# Patient Record
Sex: Male | Born: 1935 | Race: White | Hispanic: No | Marital: Married | Smoking: Former smoker
Health system: Southern US, Community
[De-identification: ages and names within clinical notes are randomized; demographics above are authoritative.]

## PROBLEM LIST (undated history)

## (undated) DIAGNOSIS — S065X9A Traumatic subdural hemorrhage with loss of consciousness of unspecified duration, initial encounter: Secondary | ICD-10-CM

## (undated) DIAGNOSIS — I480 Paroxysmal atrial fibrillation: Secondary | ICD-10-CM

## (undated) DIAGNOSIS — M1990 Unspecified osteoarthritis, unspecified site: Secondary | ICD-10-CM

## (undated) DIAGNOSIS — S065XAA Traumatic subdural hemorrhage with loss of consciousness status unknown, initial encounter: Secondary | ICD-10-CM

## (undated) DIAGNOSIS — K219 Gastro-esophageal reflux disease without esophagitis: Secondary | ICD-10-CM

## (undated) DIAGNOSIS — I10 Essential (primary) hypertension: Secondary | ICD-10-CM

## (undated) HISTORY — PX: OTHER SURGICAL HISTORY: SHX169

## (undated) HISTORY — PX: INGUINAL HERNIA REPAIR: SUR1180

## (undated) HISTORY — PX: VASECTOMY: SHX75

## (undated) HISTORY — DX: Paroxysmal atrial fibrillation: I48.0

## (undated) HISTORY — PX: FOOT MASS EXCISION: SHX1663

## (undated) HISTORY — PX: TONSILLECTOMY: SUR1361

---

## 2000-05-18 ENCOUNTER — Encounter: Payer: Self-pay | Admitting: Gastroenterology

## 2000-05-18 ENCOUNTER — Other Ambulatory Visit: Admission: RE | Admit: 2000-05-18 | Discharge: 2000-05-18 | Payer: Self-pay | Admitting: Gastroenterology

## 2000-05-18 ENCOUNTER — Encounter (INDEPENDENT_AMBULATORY_CARE_PROVIDER_SITE_OTHER): Payer: Self-pay | Admitting: Specialist

## 2001-07-16 ENCOUNTER — Encounter: Payer: Self-pay | Admitting: Internal Medicine

## 2001-07-16 ENCOUNTER — Ambulatory Visit (HOSPITAL_COMMUNITY): Admission: RE | Admit: 2001-07-16 | Discharge: 2001-07-16 | Payer: Self-pay | Admitting: Internal Medicine

## 2002-09-12 ENCOUNTER — Ambulatory Visit (HOSPITAL_BASED_OUTPATIENT_CLINIC_OR_DEPARTMENT_OTHER): Admission: RE | Admit: 2002-09-12 | Discharge: 2002-09-12 | Payer: Self-pay | Admitting: Orthopedic Surgery

## 2002-09-12 ENCOUNTER — Encounter (INDEPENDENT_AMBULATORY_CARE_PROVIDER_SITE_OTHER): Payer: Self-pay | Admitting: *Deleted

## 2004-11-22 ENCOUNTER — Ambulatory Visit (HOSPITAL_COMMUNITY): Admission: RE | Admit: 2004-11-22 | Discharge: 2004-11-22 | Payer: Self-pay | Admitting: Internal Medicine

## 2005-09-26 ENCOUNTER — Ambulatory Visit: Payer: Self-pay | Admitting: Gastroenterology

## 2006-06-15 ENCOUNTER — Ambulatory Visit: Payer: Self-pay | Admitting: Gastroenterology

## 2006-06-22 ENCOUNTER — Encounter (INDEPENDENT_AMBULATORY_CARE_PROVIDER_SITE_OTHER): Payer: Self-pay | Admitting: *Deleted

## 2006-06-22 ENCOUNTER — Ambulatory Visit: Payer: Self-pay | Admitting: Gastroenterology

## 2007-05-21 ENCOUNTER — Ambulatory Visit: Payer: Self-pay | Admitting: Gastroenterology

## 2007-10-09 HISTORY — PX: TRANSURETHRAL RESECTION OF PROSTATE: SHX73

## 2007-10-20 ENCOUNTER — Inpatient Hospital Stay (HOSPITAL_COMMUNITY): Admission: RE | Admit: 2007-10-20 | Discharge: 2007-10-22 | Payer: Self-pay | Admitting: Urology

## 2007-10-20 ENCOUNTER — Encounter (INDEPENDENT_AMBULATORY_CARE_PROVIDER_SITE_OTHER): Payer: Self-pay | Admitting: Urology

## 2008-04-21 DIAGNOSIS — K449 Diaphragmatic hernia without obstruction or gangrene: Secondary | ICD-10-CM | POA: Insufficient documentation

## 2008-04-21 DIAGNOSIS — K648 Other hemorrhoids: Secondary | ICD-10-CM | POA: Insufficient documentation

## 2008-04-21 DIAGNOSIS — D126 Benign neoplasm of colon, unspecified: Secondary | ICD-10-CM

## 2008-04-21 DIAGNOSIS — K5730 Diverticulosis of large intestine without perforation or abscess without bleeding: Secondary | ICD-10-CM

## 2008-04-21 DIAGNOSIS — K222 Esophageal obstruction: Secondary | ICD-10-CM | POA: Insufficient documentation

## 2008-04-21 DIAGNOSIS — K208 Other esophagitis: Secondary | ICD-10-CM

## 2008-05-02 ENCOUNTER — Ambulatory Visit (HOSPITAL_COMMUNITY): Admission: RE | Admit: 2008-05-02 | Discharge: 2008-05-02 | Payer: Self-pay | Admitting: Internal Medicine

## 2009-05-04 ENCOUNTER — Encounter (INDEPENDENT_AMBULATORY_CARE_PROVIDER_SITE_OTHER): Payer: Self-pay | Admitting: *Deleted

## 2009-05-11 ENCOUNTER — Ambulatory Visit (HOSPITAL_COMMUNITY): Admission: RE | Admit: 2009-05-11 | Discharge: 2009-05-11 | Payer: Self-pay | Admitting: Orthopaedic Surgery

## 2009-06-05 ENCOUNTER — Encounter (HOSPITAL_COMMUNITY): Admission: RE | Admit: 2009-06-05 | Discharge: 2009-07-05 | Payer: Self-pay | Admitting: Internal Medicine

## 2009-07-04 ENCOUNTER — Ambulatory Visit: Payer: Self-pay | Admitting: Gastroenterology

## 2009-07-16 ENCOUNTER — Ambulatory Visit: Payer: Self-pay | Admitting: Gastroenterology

## 2009-07-16 ENCOUNTER — Encounter: Payer: Self-pay | Admitting: Gastroenterology

## 2009-07-18 ENCOUNTER — Encounter: Payer: Self-pay | Admitting: Gastroenterology

## 2010-02-20 ENCOUNTER — Telehealth: Payer: Self-pay | Admitting: Gastroenterology

## 2010-02-27 ENCOUNTER — Telehealth: Payer: Self-pay | Admitting: Gastroenterology

## 2010-02-27 ENCOUNTER — Ambulatory Visit: Payer: Self-pay | Admitting: Gastroenterology

## 2010-02-27 DIAGNOSIS — K219 Gastro-esophageal reflux disease without esophagitis: Secondary | ICD-10-CM | POA: Insufficient documentation

## 2010-02-27 DIAGNOSIS — C61 Malignant neoplasm of prostate: Secondary | ICD-10-CM | POA: Insufficient documentation

## 2010-02-27 LAB — CONVERTED CEMR LAB
ALT: 22 units/L (ref 0–53)
AST: 21 units/L (ref 0–37)
Albumin: 3.9 g/dL (ref 3.5–5.2)
Alkaline Phosphatase: 67 units/L (ref 39–117)
Basophils Relative: 0.1 % (ref 0.0–3.0)
Eosinophils Relative: 2.6 % (ref 0.0–5.0)
Iron: 57 ug/dL (ref 42–165)
MCV: 89.1 fL (ref 78.0–100.0)
Monocytes Absolute: 0.5 10*3/uL (ref 0.1–1.0)
Neutrophils Relative %: 74.4 % (ref 43.0–77.0)
RBC: 4.9 M/uL (ref 4.22–5.81)
Total Protein: 6.8 g/dL (ref 6.0–8.3)
WBC: 9.7 10*3/uL (ref 4.5–10.5)

## 2011-01-07 NOTE — Progress Notes (Signed)
Summary: refill omeprazole  Phone Note Call from Patient Call back at [facility] Phone [phone]   Refills Requested: Medication #1:  OMEPRAZOLE 20 MG TBEC Take 1 capsule by mouth twice a day. Initial call taken by: Harlow Mares CMA Duncan Dull),  February 20, 2010 9:41 AM Caller: Patient Summary of Call: patient needs omeprazole refilled fax [phone] wants a 90 day supply. I had him make an appt for 02/27/2010 since he has not been seen since 2008. Initial call taken by: Harlow Mares CMA Duncan Dull),  February 20, 2010 9:41 AM  Follow-up for Phone Call        Rx faxed. Follow-up by: Ashok Cordia RN,  February 20, 2010 10:06 AM    New/Updated Medications: OMEPRAZOLE 20 MG TBEC (OMEPRAZOLE) Take 1 capsule by mouth twice a day Prescriptions: OMEPRAZOLE 20 MG TBEC (OMEPRAZOLE) Take 1 capsule by mouth twice a day  #180 x 3   Entered by:   Ashok Cordia RN   Authorized by:   Mardella Layman MD [facility]   Signed by:   Ashok Cordia RN on 02/20/2010   Method used:   Printed then faxed to ...       [facility] (retail)       [address]       [place], [place]  [address]       Ph: [phone]       Fax: [phone]   RxID:   [id number]

## 2011-01-07 NOTE — Progress Notes (Signed)
Summary: med update  Phone Note Call from Patient Call back at Home Phone [phone]   Caller: Patient Call For: Dr. Jarold Motto Reason for Call: Talk to Nurse Summary of Call: pt wants Lupita Leash to know that his Zestoretic rx is 20-12.5mg  tablets Initial call taken by: Vallarie Mare,  February 27, 2010 1:17 PM  Follow-up for Phone Call        Dose changed on med sheet. Follow-up by: Ashok Cordia RN,  February 27, 2010 2:10 PM    New/Updated Medications: ZESTORETIC 20-12.5 MG TABS (LISINOPRIL-HYDROCHLOROTHIAZIDE) 1 qd

## 2011-01-07 NOTE — Assessment & Plan Note (Signed)
Summary: follow up gerd/lk   History of Present Illness Visit Type: Follow-up Visit Primary GI MD: Sheryn Bison MD FACP FAGA Primary Provider: Carylon Perches, MD Chief Complaint: follow-up visit GERD. Pt. has no complaints at this time History of Present Illness:   77 year old Caucasian male with chronic GERD currently asymptomatic on daily omeprazole. He is up-to-date with colonoscopy exam and denies lower GI complaints. He had endoscopy 10 years ago with dilation of esophageal stricture. There was no evidence of Barrett's esophagitis. He currently denies dysphasia, chest pain, anorexia, weight loss, rectal bleeding, or any hepatobiliary complaints. He is followed by Dr. Shiela Mayer in urology for prostate cancer and is status post TURP. He denies current genitourinary complaints.   GI Review of Systems      Denies abdominal pain, acid reflux, belching, bloating, chest pain, dysphagia with liquids, dysphagia with solids, heartburn, loss of appetite, nausea, vomiting, vomiting blood, weight loss, and  weight gain.        Denies anal fissure, black tarry stools, change in bowel habit, diarrhea, diverticulosis, fecal incontinence, heme positive stool, hemorrhoids, irritable bowel syndrome, jaundice, light color stool, liver problems, rectal bleeding, and  rectal pain. Preventive Screening-Counseling & Management  Alcohol-Tobacco     Smoking Status: quit      Drug Use:  no.      Current Medications (verified): 1)  Omeprazole 20 Mg Tbec (Omeprazole) .... Take 1 Capsule By Mouth Once A Day 2)  Zestoretic 10-12.5 Mg Tabs (Lisinopril-Hydrochlorothiazide) .Marland Kitchen.. 1 Tablet By Mouth Once Daily 3)  Metamucil 30.9 % Powd (Psyllium) .... Take Mixture Once Daily 4)  Stool Softener 100 Mg Caps (Docusate Sodium) .Marland Kitchen.. 1 Capsule By Mouth Once Daily 5)  Aspirin 81 Mg Tbec (Aspirin) .Marland Kitchen.. 1 Tablet By Mouth Once Daily  Allergies (verified): No Known Drug Allergies  Past History:  Past medical, surgical,  family and social histories (including risk factors) reviewed for relevance to current acute and chronic problems.  Past Medical History: Reviewed history from 04/21/2008 and no changes required. Current Problems:  HEMORRHOIDS, INTERNAL (ICD-455.0) DIVERTICULOSIS, COLON (ICD-562.10) COLONIC POLYPS (ICD-211.3) EROSIVE ESOPHAGITIS (ICD-530.19) ESOPHAGEAL STRICTURE (ICD-530.3) HIATAL HERNIA (ICD-553.3)  Past Surgical History: Tonsillectomy Hernia Surgery  Family History: Reviewed history and no changes required. No FH of Colon Cancer: Lung Cancer-father Family History of Heart Disease: Mother  Social History: Reviewed history and no changes required. Patient is a former smoker.  Married  2 boys Alcohol Use - no Daily Caffeine Use Illicit Drug Use - no Retired  Retail buyer Status:  quit Drug Use:  no  Review of Systems       The patient complains of arthritis/joint pain, muscle pains/cramps, and urination changes/pain.  The patient denies allergy/sinus, anemia, anxiety-new, back pain, blood in urine, breast changes/lumps, change in vision, confusion, cough, coughing up blood, depression-new, fainting, fatigue, fever, headaches-new, hearing problems, heart murmur, heart rhythm changes, itching, night sweats, nosebleeds, shortness of breath, skin rash, sleeping problems, sore throat, swelling of feet/legs, swollen lymph glands, thirst - excessive, urination - excessive, urine leakage, vision changes, and voice change.    Vital Signs:  Patient profile:   77 year old male Height:      70 inches Weight:      192.25 pounds BMI:     27.68 Pulse rate:   76 / minute Pulse rhythm:   regular BP sitting:   108 / 64  (left arm)  Vitals Entered By: Milford Cage NCMA (February 27, 2010 11:28 AM)  Physical Exam  General:  Well developed, well nourished, no acute distress.healthy appearing.   Head:  Normocephalic and atraumatic. Eyes:  PERRLA, no icterus.exam deferred to  patient's ophthalmologist.   Lungs:  Clear throughout to auscultation. Heart:  Regular rate and rhythm; no murmurs, rubs,  or bruits. Abdomen:  Soft, nontender and nondistended. No masses, hepatosplenomegaly or hernias noted. Normal bowel sounds. Extremities:  No clubbing, cyanosis, edema or deformities noted. Neurologic:  Alert and  oriented x4;  grossly normal neurologically. Cervical Nodes:  No significant cervical adenopathy. Psych:  Alert and cooperative. Normal mood and affect.   Impression & Recommendations:  Problem # 1:  GERD (ICD-530.81) Assessment Improved Continue antireflux maneuvers and daily omeprazole 20 mg 30 minutes before breakfast. Because of the chronicity of his medication use I will check CBC, liver profile, and anemia profile.  Problem # 2:  HEMORRHOIDS, INTERNAL (ICD-455.0) Assessment: Improved High fiber diet as tolerated and continue daily Metamucil.  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 Vonita Moss as needed.  Other Orders: TLB-Hepatic/Liver Function Pnl (80076-HEPATIC) TLB-B12, Serum-Total ONLY (16109-U04) TLB-Ferritin (82728-FER) TLB-Folic Acid (Folate) (82746-FOL) TLB-IBC Pnl (Iron/FE;Transferrin) (83550-IBC) TLB-CBC Platelet - w/Differential (85025-CBCD)  Patient Instructions: 1)  Please go to the basement for lab work. 2)  Please continue current medications.  3)  The medication list was reviewed and reconciled.  All changed / newly prescribed medications were explained.  A complete medication list was provided to the patient / caregiver. 4)  Please schedule a follow-up appointment as needed.  5)  Copy sent to : Dr. Carylon Perches and Dr. Larey Dresser in urology

## 2011-04-22 NOTE — Procedures (Signed)
NAME:  Glenn Bean, HUNZEKER NO.:  [id number]   MEDICAL RECORD NO.:  [id number]          PATIENT TYPE:  REC   LOCATION:  RESP                          FACILITY:  [facility]   PHYSICIAN:  Kingsley Callander. Ouida Sills, MD       DATE OF BIRTH:  [birth year]   DATE OF PROCEDURE:  06/05/2009  DATE OF DISCHARGE:                                  STRESS TEST   [name] underwent a Myoview stress test for evaluation of symptoms of  chest pain.  He exercised 7 minutes and 28 seconds (1 minute 28 seconds  in stage III of the Bruce protocol) attaining a maximal heart rate of  133 (90% of the age-predicted maximal heart rate) and a workload of 8.8  METs and discontinued exercise due to fatigue.  The treadmill was  manually slowed during stage III to 2.9 miles per hour.  There were no  symptoms of chest pain.  There were infrequent PVCs.  There were no ST-  segment changes diagnostic of ischemia.  The baseline electrocardiogram  revealed normal sinus rhythm at 71 beats per minute with sinus  arrhythmia.   IMPRESSION:  No evidence of exercise-induced ischemia.  Myoview images  pending.      Kingsley Callander. Ouida Sills, MD  Electronically Signed     ROF/MEDQ  D:  06/05/2009  T:  06/05/2009  Job:  [id number]

## 2011-04-22 NOTE — Op Note (Signed)
NAME:  DESTIN, VINSANT NO.:  [id number]   MEDICAL RECORD NO.:  [id number]          PATIENT TYPE:  INP   LOCATION:  X006                         FACILITY:  [facility]   PHYSICIAN:  Maretta Bees. Vonita Moss, M.D.DATE OF BIRTH:  [birth year]   DATE OF PROCEDURE:  10/20/2007  DATE OF DISCHARGE:                               OPERATIVE REPORT   PREOPERATIVE DIAGNOSIS:  Bladder outlet obstruction/benign prostatic  hypertrophy, large bladder diverticulum and large postvoid residual  urines.   POSTOPERATIVE DIAGNOSIS:  Bladder outlet obstruction/benign prostatic  hypertrophy, large bladder diverticulum and large postvoid residual  urines.   PROCEDURE:  Transurethral resection of prostate.   SURGEON:  Dr. Larey Dresser.   ANESTHESIA:  General.   INDICATIONS:  This gentleman had a TUR of prostate in the 80s and in the  last recent months he has developed some increased postvoid residual  urines contributed in parts due to a large posterior bladder  diverticulum.  He typically holds about 400 mL in bladder 400 mL of  retained urine in the diverticulum.  He undergone renal ultrasound that  showed no obstruction of the upper tracts.  Urodynamics showed a large  bladder that was compliant but somewhat hyposensitive.  He could not  void during that study although he did void and has voided  spontaneously.  He has been placed on Urecholine and I&O catheter to  decompress the bladder.  He was cystoscoped and was noted to be some  anterior obstructing tissue and was felt appropriate to resect out  further tissue to give him a better chance to void to completion.   PROCEDURE:  The patient brought to the operating room, placed in  lithotomy position.  External genitalia were prepped and draped in usual  fashion.  He was sounded from 24 to 1 [place] with [employer].  A  28-French resectoscope sheath was inserted and he had a collar of  obstructing tissue at the apical part of the  prostate plus anterior  tissue dropping down into the prostatic fossa.  His bladder diverticulum  was just beyond the trigone and very close and directly posterior and  there were no lesions within the diverticulum or the normal bladder.  The UO's were right next to the bladder neck.  First of all I resected  the collar of tissue apically and then resected some tissue on the floor  of the bladder along with the bladder neck at 6 o'clock, then anterior  tissue was resected down to capsule.  At the completion of the procedure  residual chips removed from the bladder and the diverticulum.  The  ureteral orifices were intact but again as noted just inside the bladder  neck.  There was good hemostasis.  Estimated  blood loss was 100 mL.  He tolerated the procedure well and was taken to  recovery room in good condition after inserting a 24-French 30 mL  catheter with 45 mL placed in the balloon.  Catheter was placed on  traction and irrigation was clear.  Estimated blood loss was 100 mL.      Maretta Bees. Vonita Moss,  M.D.  Electronically Signed     LJP/MEDQ  D:  10/20/2007  T:  10/21/2007  Job:  [id number]

## 2011-04-25 NOTE — Op Note (Signed)
   NAME:  JACK, BOLIO NO.:  [id number]   MEDICAL RECORD NO.:  [id number]                   PATIENT TYPE:  AMB   LOCATION:  DSC                                  FACILITY:  [facility]   PHYSICIAN:  Harvie Junior, M.D.                DATE OF BIRTH:  11/28/1933   DATE OF PROCEDURE:  09/12/2002  DATE OF DISCHARGE:                                 OPERATIVE REPORT   PREOPERATIVE DIAGNOSIS:  Mass second toe.   POSTOPERATIVE DIAGNOSIS:  Mass second toe.   OPERATION:  Excision mass second toe.   SURGEON:  Harvie Junior, M.D.   ASSISTANT:  Currie Paris. Thedore Mins.   ANESTHESIA:  Ankle block   BRIEF HISTORY:  He is a 77 year old male with a long history of having a  mass on his second toe.  He came to have it evaluated.  It is a freely  mobile mass.  It was felt that it was a nonmalignancy.  Ultimately, we  talked about excisional biopsy versus monitoring because of the large size  of it.  We did feel that it needed to be evaluated with MRI or have an  excisional biopsy.  He ultimately came for an excisional biopsy.   DESCRIPTION OF PROCEDURE:  The patient was taken to the operating room and  after adequate anesthesia was obtained with the patient on the operating  room table, the right leg was then prepped and draped in the usual sterile  fashion.  After this, an elliptical incision was made to ellipse some skin.  The mass was identified.  The borders were freed.  It was fully excised and  sent to pathology.  Following this, the wound was copiously irrigated and  suctioned dry.  The skin was then closed with 4-0 Nylon interrupted sutures.  A sterile compressive dressing was applied and the patient was then taken to  the recovery room where he was noted to be in satisfactory condition.  Estimated blood loss during the procedure was unknown.                                                 Harvie Junior, M.D.    Ranae Plumber  D:  09/12/2002  T:  09/13/2002   Job:  [id number]   cc:   Kingsley Callander. Ouida Sills, M.D.

## 2011-04-25 NOTE — Discharge Summary (Signed)
NAME:  SHANDY, VI NO.:  [id number]   MEDICAL RECORD NO.:  [id number]          PATIENT TYPE:  INP   LOCATION:  1427                         [facility]AC[facility]:  [facility]   PHYSICIAN:  Maretta Bees. Vonita Moss, M.D.DATE OF BIRTH:  30-Oct-1933   DATE OF ADMISSION:  10/20/2007  DATE OF DISCHARGE:  10/22/2007                               DISCHARGE SUMMARY   FINAL DIAGNOSES:  1. Bladder outlet obstruction due to benign prostatic hyperplasia.  2. Elevated residual urines due to #1.  3. Bladder diverticulum.  4. Hypertension.  5. Gastroesophageal reflux disease.  6. Hypotonic bladder.  7. Prostate carcinoma found in prostate chips.   PROCEDURE:  Transurethral resection of prostate on October 20, 2007.   HISTORY:  This gentleman has had a significant problem with elevated  residual urines and large bladder diverticulum.  He had a previous TUR  of the prostate in the 1980s.  Urodynamics showed a hypotonic bladder  and some partial obstruction.   HOSPITAL COURSE:  After admission, he underwent TUR of the prostate and  his postop course was benign.  His Foley catheter was removed on  October 22, 2007 and he voided satisfactorily and is ready for  discharge.   DIET:  He is to go home on a heart-healthy diet.   CONDITION ON DISCHARGE:  He was sent home in improved condition.   ACTIVITY:  He will go home on limited activity for 1 month.   FOLLOWUP:  He will return to the office in 2 weeks in followup.   DISCHARGE MEDICATIONS:  He was sent home on his Zestoretic and can  restart his 81-mg aspirin and for further antibiotic therapy will be  evaluated pending his culture results.      Maretta Bees. Vonita Moss, M.D.  Electronically Signed     LJP/MEDQ  D:  11/01/2007  T:  11/02/2007  Job:  [id number]

## 2011-07-23 ENCOUNTER — Telehealth: Payer: Self-pay | Admitting: Gastroenterology

## 2011-07-23 MED ORDER — OMEPRAZOLE 20 MG PO CPDR: 20.0000 mg | DELAYED_RELEASE_CAPSULE | Freq: Every day | ORAL | Status: DC

## 2011-07-23 NOTE — Telephone Encounter (Signed)
rx sent pt aware 

## 2011-08-30 ENCOUNTER — Emergency Department (HOSPITAL_COMMUNITY)
Admission: EM | Admit: 2011-08-30 | Discharge: 2011-08-30 | Discharge status: Against Medical Advice | Payer: Self-pay | Attending: Emergency Medicine | Admitting: Emergency Medicine

## 2011-08-30 ENCOUNTER — Encounter: Payer: Self-pay | Admitting: *Deleted

## 2011-08-30 ENCOUNTER — Emergency Department (HOSPITAL_COMMUNITY)
Admission: EM | Admit: 2011-08-30 | Discharge: 2011-08-31 | Disposition: A | Discharge status: Home / Self Care | Payer: Medicare Other | Attending: Emergency Medicine | Admitting: Emergency Medicine

## 2011-08-30 DIAGNOSIS — Y92410 Unspecified street and highway as the place of occurrence of the external cause: Secondary | ICD-10-CM | POA: Insufficient documentation

## 2011-08-30 DIAGNOSIS — Z5320 Procedure and treatment not carried out because of patient's decision for unspecified reasons: Secondary | ICD-10-CM | POA: Insufficient documentation

## 2011-08-30 DIAGNOSIS — S0280XA Fracture of other specified skull and facial bones, unspecified side, initial encounter for closed fracture: Secondary | ICD-10-CM | POA: Insufficient documentation

## 2011-08-30 DIAGNOSIS — Y992 Volunteer activity: Secondary | ICD-10-CM | POA: Insufficient documentation

## 2011-08-30 DIAGNOSIS — S0120XA Unspecified open wound of nose, initial encounter: Secondary | ICD-10-CM | POA: Insufficient documentation

## 2011-08-30 DIAGNOSIS — S0292XA Unspecified fracture of facial bones, initial encounter for closed fracture: Secondary | ICD-10-CM

## 2011-08-30 DIAGNOSIS — I10 Essential (primary) hypertension: Secondary | ICD-10-CM | POA: Insufficient documentation

## 2011-08-30 DIAGNOSIS — M542 Cervicalgia: Secondary | ICD-10-CM | POA: Insufficient documentation

## 2011-08-30 DIAGNOSIS — S022XXA Fracture of nasal bones, initial encounter for closed fracture: Secondary | ICD-10-CM | POA: Insufficient documentation

## 2011-08-30 DIAGNOSIS — S0121XA Laceration without foreign body of nose, initial encounter: Secondary | ICD-10-CM
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
## 2011-08-31 ENCOUNTER — Emergency Department (HOSPITAL_COMMUNITY): Payer: Medicare Other

## 2011-08-31 MED ORDER — OXYCODONE-ACETAMINOPHEN 5-325 MG PO TABS: 1.0000 | ORAL_TABLET | ORAL | Status: AC | PRN

## 2011-08-31 MED ORDER — ONDANSETRON HCL 4 MG/2ML IJ SOLN
4.0000 mg | Freq: Once | INTRAMUSCULAR | Status: AC
  Administered 2011-08-31: 4 mg via INTRAVENOUS
  Filled 2011-08-31: qty 2

## 2011-08-31 MED ORDER — LIDOCAINE-EPINEPHRINE-TETRACAINE (LET) SOLUTION: 3.0000 mL | Freq: Once | NASAL | Status: DC

## 2011-08-31 MED ORDER — TETANUS-DIPHTH-ACELL PERTUSSIS 5-2.5-18.5 LF-MCG/0.5 IM SUSP
0.5000 mL | Freq: Once | INTRAMUSCULAR | Status: AC
  Administered 2011-08-31: 0.5 mL via INTRAMUSCULAR
  Filled 2011-08-31: qty 0.5

## 2011-08-31 MED ORDER — AMOXICILLIN 500 MG PO CAPS: 500.0000 mg | ORAL_CAPSULE | Freq: Three times a day (TID) | ORAL | Status: AC

## 2011-08-31 MED ORDER — LIDOCAINE-EPINEPHRINE-TETRACAINE (LET) SOLUTION
NASAL | Status: AC
  Filled 2011-08-31: qty 3

## 2011-08-31 MED ORDER — MORPHINE SULFATE 4 MG/ML IJ SOLN
4.0000 mg | Freq: Once | INTRAMUSCULAR | Status: AC
  Administered 2011-08-31: 4 mg via INTRAVENOUS
  Filled 2011-08-31: qty 1

## 2011-08-31 MED ORDER — LIDOCAINE HCL (PF) 1 % IJ SOLN
INTRAMUSCULAR | Status: AC
  Administered 2011-08-31: 5 mL
  Filled 2011-08-31: qty 5
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
## 2011-09-16 LAB — BASIC METABOLIC PANEL
BUN: 13
CO2: 33 — ABNORMAL HIGH
Calcium: 8.3 — ABNORMAL LOW
Calcium: 9.4
GFR calc non Af Amer: 57 — ABNORMAL LOW
Glucose, Bld: 106 — ABNORMAL HIGH
Glucose, Bld: 124 — ABNORMAL HIGH
Potassium: 3.5
Sodium: 136

## 2011-09-16 LAB — URINE CULTURE
Colony Count: >=100000
Special Requests: NEGATIVE

## 2011-09-16 LAB — CBC
Platelets: 217
RDW: 13

## 2011-10-08 ENCOUNTER — Ambulatory Visit (HOSPITAL_COMMUNITY)
Admission: RE | Admit: 2011-10-08 | Discharge: 2011-10-08 | Disposition: A | Discharge status: Home / Self Care | Payer: Medicare Other | Source: Ambulatory Visit | Attending: Internal Medicine | Admitting: Internal Medicine

## 2011-10-08 DIAGNOSIS — I10 Essential (primary) hypertension: Secondary | ICD-10-CM | POA: Insufficient documentation

## 2011-10-08 DIAGNOSIS — M436 Torticollis: Secondary | ICD-10-CM | POA: Insufficient documentation

## 2011-10-08 DIAGNOSIS — M6281 Muscle weakness (generalized): Secondary | ICD-10-CM | POA: Insufficient documentation

## 2011-10-08 DIAGNOSIS — IMO0001 Reserved for inherently not codable concepts without codable children: Secondary | ICD-10-CM | POA: Insufficient documentation

## 2011-10-08 DIAGNOSIS — M5382 Other specified dorsopathies, cervical region: Secondary | ICD-10-CM | POA: Insufficient documentation

## 2011-10-08 DIAGNOSIS — M542 Cervicalgia: Secondary | ICD-10-CM | POA: Insufficient documentation
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
## 2011-10-15 ENCOUNTER — Ambulatory Visit (HOSPITAL_COMMUNITY)
Admission: RE | Admit: 2011-10-15 | Discharge: 2011-10-15 | Disposition: A | Discharge status: Home / Self Care | Payer: Medicare Other | Source: Ambulatory Visit | Attending: Internal Medicine | Admitting: Internal Medicine

## 2011-10-15 DIAGNOSIS — I10 Essential (primary) hypertension: Secondary | ICD-10-CM | POA: Insufficient documentation

## 2011-10-15 DIAGNOSIS — M542 Cervicalgia: Secondary | ICD-10-CM | POA: Insufficient documentation

## 2011-10-15 DIAGNOSIS — IMO0001 Reserved for inherently not codable concepts without codable children: Secondary | ICD-10-CM | POA: Insufficient documentation

## 2011-10-15 DIAGNOSIS — M6281 Muscle weakness (generalized): Secondary | ICD-10-CM | POA: Insufficient documentation

## 2011-10-15 NOTE — Progress Notes (Signed)
Physical Therapy Treatment Patient Details  Name: Glenn Bean MRN: [id number] Date of Birth: 1933/06/29  Today's Date: 10/15/2011 Time: 4098-1191 Time Calculation (min): 50 min Charges: 69' TE, 5' Manual Visit#: 2  of 8   Re-eval: 11/07/11    Subjective: Symptoms/Limitations Symptoms: Pt reports that he is doing some of his exercises at home.  Pt denies any pain including radicular pain to L shoulder, but continues to have the most difficulty with cervical ROM.  Objective: Denies dizziness today with nystagmus testing, sitting <> S/L both directions.  Pain Assessment Currently in Pain?: No/denies  Exercise/Treatments Stretches Upper Trapezius Stretch: 3 reps;30 seconds;Limitations Upper Trapezius Stretch Limitations: Bilateral Levator Stretch: 3 reps;30 seconds;Limitations Levator Stretch Limitations: Bilateral  Neck Exercises Neck Retraction: Supine;10 reps;Limitations Neck Retraction Limitations: Supine: Retraction w/neck (capital ) flexion 2x5 w/5 sec hold Neck Rotation - Right: AROM;Seated;5 reps;Limitations Neck Rotation - Right Limitations: Eyes fixed on object x10, Eyes moving with rotation x10 Neck Rotation - Left: AROM;Seated;Limitations Neck Rotation - Left Limitations: Eyes fixed on object x10, Eyes moving with rotation x10 Upper Extremity Lift: Limitations Uppder Extremity Lift Limitations: UE Lift and Chop to Bilateral sides w/head following, UE rotation with neck rotation opposite direction 10x each in supine Additional Neck Exercises UBE (Upper Arm Bike): 4 min 1.0 backwards for posture  Manual Therapy Manual Therapy: Other (comment) Other Manual Therapy: SCS to 3 locations to L cervical/shoulder region x5 minutes.    Physical Therapy Assessment and Plan PT Assessment and Plan Clinical Impression Statement: decreased spasms after manual technique today.  Overall improved his neck rotation with multimodal cueing in the supine position.  PT Plan: Add:  W-backs, x-v (start in supine if necessary).    Goals    Problem List Patient Active Problem List  Diagnoses  . MALIGNANT NEOPLASM OF PROSTATE  . COLONIC POLYPS  . HEMORRHOIDS, INTERNAL  . EROSIVE ESOPHAGITIS  . ESOPHAGEAL STRICTURE  . Esophageal reflux  . HIATAL HERNIA  . DIVERTICULOSIS, COLON  . Neck pain  . Neck muscle weakness  . Neck stiffness    PT - End of Session Activity Tolerance: Patient tolerated treatment well  Judah Chevere 10/15/2011, 5:02 PM

## 2011-10-16 ENCOUNTER — Encounter: Payer: Self-pay | Admitting: *Deleted

## 2011-10-17 ENCOUNTER — Ambulatory Visit (HOSPITAL_COMMUNITY)
Admission: RE | Admit: 2011-10-17 | Discharge: 2011-10-17 | Disposition: A | Discharge status: Home / Self Care | Payer: Medicare Other | Source: Ambulatory Visit | Attending: Physical Therapy | Admitting: Physical Therapy

## 2011-10-17 NOTE — Progress Notes (Signed)
Physical Therapy Treatment Patient Details  Name: Glenn Bean MRN: [id number] Date of Birth: October 04, 1933  Today's Date: 10/17/2011 Time: 4098-1191 Time Calculation (min): 48 min Charges: 76' NMR, 8' TE Visit#: 3  of 8   Re-eval: 11/07/11    Subjective: Symptoms/Limitations Symptoms: Pt reports that he is doing some of his exercises at home.  He denies pain.  Pain Assessment Currently in Pain?: No/denies  Exercise/Treatments Stretches Upper Trapezius Stretch: Limitations Upper Trapezius Stretch Limitations: Manual SB x5 each direction Seated Diaphragmatic breathing -cueing to quite accessory breathing muscles including scalenes and UT Neck Exercises Neck Retraction: Supine;10 reps Neck Retraction Limitations: Supine: Retraction w/neck (capital ) flexion 2x5 w/5 sec hold Neck Extension: 15 reps;AROM;Seated;Limitations Neck Extension Limitations: Cueing to improve rhomboid and middle and low trap activiation Neck Lateral Flexion - Right: AROM;5 reps;Limitations Neck Lateral Flexion - Right Limitations: Supine w/manual resistance Neck Lateral Flexion - Left: AAROM;5 reps;Supine;Limitations Neck Lateral Flexion - Left Limitations: Supine w/manual resistance Neck Rotation - Right: AROM;5 reps;Supine Neck Rotation - Right Limitations: Supine w/manual resistance Neck Rotation - Left: AROM;5 reps;Limitations Neck Rotation - Left Limitations: Supine w/manual resistance Shoulder Extension: AAROM;Both;10 reps;Seated;Limitations Shoulder Extension Limitations: manual cueing to improve LUE shoulder extension w/appropriate posture W Back: 10 reps;Limitations W Back Limitations: SUPINE X to V: 10 reps;Limitations X to V Limitations: SUPINE Uppder Extremity Lift Limitations: UE Lift and Chop to Bilateral sides w/head following, UE rotation with neck rotation opposite direction 10x each in supine Additional Neck Exercises UBE (Upper Arm Bike): 8 min 1.0 backwards for posture     Physical Therapy Assessment and Plan PT Assessment and Plan Clinical Impression Statement: Today's treatment focus on postural re-education through use of BUE movement. Pt has made some gains with neck rotation however continues to have the greatest limitations with cervical sidebending and L shoulder extension.   PT Plan: Cont to progress cervical SB and encourage appropriate posture.     Goals    Problem List Patient Active Problem List  Diagnoses  . MALIGNANT NEOPLASM OF PROSTATE  . COLONIC POLYPS  . HEMORRHOIDS, INTERNAL  . EROSIVE ESOPHAGITIS  . ESOPHAGEAL STRICTURE  . Esophageal reflux  . HIATAL HERNIA  . DIVERTICULOSIS, COLON  . Neck pain  . Neck muscle weakness  . Neck stiffness    PT - End of Session Activity Tolerance: Patient tolerated treatment well  Glenn Bean 10/17/2011, 4:52 PM

## 2011-10-22 ENCOUNTER — Ambulatory Visit (HOSPITAL_COMMUNITY)
Admission: RE | Admit: 2011-10-22 | Discharge: 2011-10-22 | Disposition: A | Discharge status: Home / Self Care | Payer: Medicare Other | Source: Ambulatory Visit | Attending: Internal Medicine | Admitting: Internal Medicine

## 2011-10-22 NOTE — Progress Notes (Signed)
Physical Therapy Treatment Patient Details  Name: Glenn Bean MRN: [id number] Date of Birth: 08/09/1933  Today's Date: 10/22/2011 Time: 1308-6578 Time Calculation (min): 40 min Visit#: 4  of 8   Re-eval: 11/07/11  Charge: therex 40 min'  Subjective: Symptoms/Limitations Symptoms: Pt neck pain free right now but stated he had to take some Aleve last night secondary to increased neck pain rated 4/10 last PM.   Pain Assessment Currently in Pain?: No/denies  Objective:   Exercise/Treatments Stretches Upper Trapezius Stretch: Limitations Upper Trapezius Stretch Limitations: Manual SB x 5x 15" each direction Neck Exercises Neck Retraction: Standing;5 reps;Limitations Neck Retraction Limitations: with manual/vcing for proper tech Neck Extension: 15 reps;AROM;Seated;Limitations Neck Extension Limitations: Cueing to improve rhomboid and middle and low trap activiation Neck Lateral Flexion - Right: AROM;5 reps;Limitations Neck Lateral Flexion - Right Limitations: Supine with manual resistance Neck Lateral Flexion - Left: AAROM;5 reps;Supine;Limitations Neck Lateral Flexion - Left Limitations: Supine w/manual resistance Neck Rotation - Right: AROM;5 reps;Supine Neck Rotation - Right Limitations: Supine w/manual resistance Neck Rotation - Left: AROM;5 reps;Limitations Neck Rotation - Left Limitations: Supine w/manual resistance W Back: 10 reps;Limitations W Back Limitations: Supine with cervical and scapular retraction X to V: 10 reps;Limitations X to V Limitations: supine Additional Neck Exercises UBE (Upper Arm Bike): 8 min @ 2.5 backwards for posture     Physical Therapy Assessment and Plan PT Assessment and Plan Clinical Impression Statement: Pt re-educated on proper posture with spinal cord modal to address posture.  Pt with multimodal cueing required with retraction, used hands for proprioception supine position for best tech. PT Plan: Cont to progress cervical SB and  encourage appropriate posture    Goals    Problem List Patient Active Problem List  Diagnoses  . MALIGNANT NEOPLASM OF PROSTATE  . COLONIC POLYPS  . HEMORRHOIDS, INTERNAL  . EROSIVE ESOPHAGITIS  . ESOPHAGEAL STRICTURE  . Esophageal reflux  . HIATAL HERNIA  . DIVERTICULOSIS, COLON  . Neck pain  . Neck muscle weakness  . Neck stiffness    PT - End of Session Activity Tolerance: Patient tolerated treatment well General Behavior During Session: [facility] for tasks performed Cognition: [facility] for tasks performed  Juel Burrow 10/22/2011, 3:44 PM

## 2011-10-24 ENCOUNTER — Ambulatory Visit (HOSPITAL_COMMUNITY)
Admission: RE | Admit: 2011-10-24 | Discharge: 2011-10-24 | Disposition: A | Discharge status: Home / Self Care | Payer: Medicare Other | Source: Ambulatory Visit | Attending: Internal Medicine | Admitting: Internal Medicine

## 2011-10-24 NOTE — Progress Notes (Signed)
Physical Therapy Treatment Patient Details  Name: KEKAI GETER MRN: [id number] Date of Birth: September 10, 1933  Today's Date: 10/24/2011 Time: 4098-1191 Time Calculation (min): 43 min Visit#: 5  of 8   Re-eval: 11/07/11  Charge: therex:40 min  Subjective: Symptoms/Limitations Symptoms: My neck feels okay right now. Pain Assessment Currently in Pain?: No/denies  Objective:   Exercise/Treatments Stretches Upper Trapezius Stretch: Limitations Upper Trapezius Stretch Limitations: Manual SB x 5x 15" each direction Neck Exercises Neck Retraction: Standing;10 reps;Limitations;Supine Neck Retraction Limitations: with manual/vcing for proper tech Neck Extension: 15 reps;AROM;Seated;Limitations Neck Extension Limitations: Cueing to improve rhomboid and middle and low trap activiation Neck Lateral Flexion - Right: AAROM;10 reps;Supine Neck Lateral Flexion - Right Limitations: manual resistance Neck Lateral Flexion - Left: AAROM;10 reps;Supine Neck Lateral Flexion - Left Limitations: Supine w/manual resistance Neck Rotation - Right: AROM;5 reps;Seated Neck Rotation - Left: AROM;10 reps;Seated W Back: Seated;10 reps W Back Limitations: with cervical and scapular retraction X to V: Seated;Limitations Additional Neck Exercises UBE (Upper Arm Bike): 8 min @ 3.0 backwards for posture     Physical Therapy Assessment and Plan PT Assessment and Plan Clinical Impression Statement: Progressed some therex from supine to sitting with constant cueing for posture and to look up.  Pt required less manual assistance for proper tech with cervical  retraction.  PT Plan: Continue with current POC, progress cervical SB, retraction and overall posture.    Goals    Problem List Patient Active Problem List  Diagnoses  . MALIGNANT NEOPLASM OF PROSTATE  . COLONIC POLYPS  . HEMORRHOIDS, INTERNAL  . EROSIVE ESOPHAGITIS  . ESOPHAGEAL STRICTURE  . Esophageal reflux  . HIATAL HERNIA  .  DIVERTICULOSIS, COLON  . Neck pain  . Neck muscle weakness  . Neck stiffness    PT - End of Session Activity Tolerance: Patient tolerated treatment well General Behavior During Session: [facility] for tasks performed Cognition: [facility] for tasks performed  Juel Burrow 10/24/2011, 3:20 PM

## 2011-10-29 ENCOUNTER — Ambulatory Visit (HOSPITAL_COMMUNITY)
Admission: RE | Admit: 2011-10-29 | Discharge: 2011-10-29 | Disposition: A | Discharge status: Home / Self Care | Payer: Medicare Other | Source: Ambulatory Visit | Attending: Internal Medicine | Admitting: Internal Medicine

## 2011-10-29 NOTE — Progress Notes (Signed)
Physical Therapy Treatment Patient Details  Name: Glenn Bean MRN: [id number] Date of Birth: 04/06/1933  Today's Date: 10/29/2011 Time: 7829-5621 Time Calculation (min): 42 min Visit#: 6  of 8   Re-eval: 11/07/11  Charge: therex 34 min  Subjective: Symptoms/Limitations Symptoms: My necks feeling fine, little pain today pain scale 4/10. Pain Assessment Currently in Pain?: Yes Pain Score:   4 Pain Location: Neck  Objective:   Exercise/Treatments Stretches Upper Trapezius Stretch: Limitations Upper Trapezius Stretch Limitations: Manual SB x 5x 15" each direction; manual UT st 3x 30" each direction Neck Exercises Neck Retraction: Standing;10 reps;Limitations;Supine Neck Retraction Limitations: with manual/vcing for proper tech Neck Extension: 15 reps;AROM;Seated;Limitations Neck Extension Limitations: Cueing to improve rhomboid and middle and low trap activiation Neck Lateral Flexion - Right: AAROM Neck Lateral Flexion - Right Limitations: manual resisance with increased ROM Neck Lateral Flexion - Left: AAROM;10 reps;Supine Neck Lateral Flexion - Left Limitations: Supine w/manual resistance Neck Rotation - Right: AROM;10 reps;Seated Neck Rotation - Right Limitations: Supine w/manual resistance Neck Rotation - Left: AROM;10 reps;Seated Neck Rotation - Left Limitations: Supine w/manual resistance Scapular Retraction: 10 reps;Seated W Back: Seated;10 reps W Back Limitations: with cervical and scapular retraction X to V: Seated;10 reps;Limitations X to V Limitations: with cueing for proper technique/posture Additional Neck Exercises UBE (Upper Arm Bike): 8 min @ 3.5 backwards for posture     Physical Therapy Assessment and Plan PT Assessment and Plan Clinical Impression Statement: Pt required less cueing for posture with seated therex exercises.  Cervical retraction still most effective in supine position for proper tech. PT Plan: Continue with current POC, progress  cervical SB, retraction and overall posture.    Goals    Problem List Patient Active Problem List  Diagnoses  . MALIGNANT NEOPLASM OF PROSTATE  . COLONIC POLYPS  . HEMORRHOIDS, INTERNAL  . EROSIVE ESOPHAGITIS  . ESOPHAGEAL STRICTURE  . Esophageal reflux  . HIATAL HERNIA  . DIVERTICULOSIS, COLON  . Neck pain  . Neck muscle weakness  . Neck stiffness    PT - End of Session Activity Tolerance: Patient tolerated treatment well General Behavior During Session: [facility] for tasks performed Cognition: [facility] for tasks performed  Juel Burrow 10/29/2011, 4:59 PM

## 2011-11-05 ENCOUNTER — Ambulatory Visit (HOSPITAL_COMMUNITY): Payer: Medicare Other | Admitting: Physical Therapy

## 2011-11-07 ENCOUNTER — Ambulatory Visit (HOSPITAL_COMMUNITY)
Admission: RE | Admit: 2011-11-07 | Discharge: 2011-11-07 | Disposition: A | Discharge status: Home / Self Care | Payer: Medicare Other | Source: Ambulatory Visit | Attending: Internal Medicine | Admitting: Internal Medicine

## 2011-11-07 NOTE — Progress Notes (Signed)
Physical Therapy Treatment/Progress Note Patient Details  Name: Glenn Bean MRN: [id number] Date of Birth: 07-07-1933  Today's Date: 11/07/2011 Time: 4098-1191 Time Calculation (min): 55 min Charges: 38'TE, 1 traction Visit#: 7  of 12   Re-eval: 11/07/11    Subjective: Symptoms/Limitations Symptoms: Pt reports that he has pain on some days and other days he feels good.  He reports adherence with HEP.  Has most pain with rotation.  Pt reports that overall he is about 25% better.  Continues to have difficulty with the feeling of increased weight on his head and has pain with activities.  Pain Assessment Currently in Pain?: No/denies   11/07/11 1700  Cervical AROM  Cervical - Right Side Bend Decreased 50%  Cervical - Left Side Bend decreased 50% without pain  Cervical - Right Rotation decreased 50% without pain  Cervical - Left Rotation decreased 50% without pain  Posture/Postural Control  Postural Limitations able to demonstrate approprate posture x3 minutes with minimal cueing. However able to independently demonstrate improved cervical posture while resting.  Continues to look up instead of down towards the ground.      Exercise/Treatments Stretches  Postural Exercise x 3 minutes without back support Neck Exercises Neck Retraction: Standing;15 reps Neck Retraction Limitations: w/yellow soft ball behind head; 10x w/alt shoulder flexion Neck Extension: 15 reps Neck Extension Limitations: Tactile cueing Neck Rotation - Right: AAROM;10 reps Neck Rotation - Right Limitations: Seated w/manual assistance and cueing for posture; Rotation 5x on wall with yellow ball Neck Rotation - Left: AROM;10 reps Neck Rotation - Left Limitations: Seated w/manual assistance and cueing for posture. Rotation 5x on wall with yellow ball Scapular Retraction: 15 reps;Seated;Limitations Scapular Retraction Limitations: Tactile cueing to improve L shoulder flexion Additional Neck Exercises UBE  (Upper Arm Bike): 4 min backwards at 2.0  Modalities Modalities: Traction Traction Type of Traction: Cervical Min (lbs): 7 lbs Max (lbs): 14lbs Hold Time: 60 sec Rest Time: 10 sec Time: 17 min  Physical Therapy Assessment and Plan PT Assessment and Plan Clinical Impression Statement: Mr. Travelstead has attended 7 OPPT visits and has made progress towards his overall goals.  He has improved his cervical ROM and strength and has decreased pain overall, however continues to be limited by muscular endurance throughout his postural muscles.  He will continue to benefit from skilled PT for 2 more visits to address remaining goals . PT Plan: D/C after 2 more visits.  Assess pain level after cervical traction. Cont with traction if he tolerated well. Please take strength and ROM measurments next visit.  Progress Note/DC note written, need to be seen to address remaining goals.     Goals Home Exercise Program Pt will Perform Home Exercise Program: Independently PT Goal: Perform Home Exercise Program - Progress: Met PT Short Term Goals Time to Complete Short Term Goals: 2 weeks PT Short Term Goal 1: Pt will report radicular pain less than 50% and pain less than 2/10 for 50% of his day. PT Short Term Goal 1 - Progress: Met PT Short Term Goal 2: Pt will report a decrease in dizziness by 50% when going from supine to sit.  PT Short Term Goal 2 - Progress: Progressing toward goal PT Short Term Goal 3: Pt will present with mild parascapular spasms. PT Short Term Goal 3 - Progress: Met PT Long Term Goals Time to Complete Long Term Goals: 4 weeks PT Long Term Goal 1: Pt will improve cervical ROM to WNL in order to safely turn head when  driving.  PT Long Term Goal 1 - Progress: Progressing toward goal PT Long Term Goal 2: Pt will report decrease of dizziness by 75% for improved quality of life. Long Term Goal 3: Pt will improve cervical strength to [facility] in order to demonstrate appropriate posture during  an entire therapy regime.   Long Term Goal 3 Progress: Progressing toward goal Long Term Goal 4: Pt will report pain less than 1x/wk to his cervical region for improved quality of life.  Long Term Goal 4 Progress: Progressing toward goal  Problem List Patient Active Problem List  Diagnoses  . MALIGNANT NEOPLASM OF PROSTATE  . COLONIC POLYPS  . HEMORRHOIDS, INTERNAL  . EROSIVE ESOPHAGITIS  . ESOPHAGEAL STRICTURE  . Esophageal reflux  . HIATAL HERNIA  . DIVERTICULOSIS, COLON  . Neck pain  . Neck muscle weakness  . Neck stiffness       Michalene Debruler 11/07/2011, 5:35 PM

## 2011-11-12 ENCOUNTER — Ambulatory Visit (HOSPITAL_COMMUNITY)
Admission: RE | Admit: 2011-11-12 | Discharge: 2011-11-12 | Disposition: A | Discharge status: Home / Self Care | Payer: Medicare Other | Source: Ambulatory Visit | Attending: Internal Medicine | Admitting: Internal Medicine

## 2011-11-12 DIAGNOSIS — I10 Essential (primary) hypertension: Secondary | ICD-10-CM | POA: Insufficient documentation

## 2011-11-12 DIAGNOSIS — M542 Cervicalgia: Secondary | ICD-10-CM | POA: Insufficient documentation

## 2011-11-12 DIAGNOSIS — M6281 Muscle weakness (generalized): Secondary | ICD-10-CM | POA: Insufficient documentation

## 2011-11-12 DIAGNOSIS — IMO0001 Reserved for inherently not codable concepts without codable children: Secondary | ICD-10-CM | POA: Insufficient documentation

## 2011-11-12 NOTE — Progress Notes (Signed)
Physical Therapy Discharge  Patient Details  Name: Glenn Bean MRN: [id number] Date of Birth: 1933-10-02  Today's Date: 11/12/2011 Time: 7829-5621 Time Calculation (min): 45 min Charges: 1 ROM, 1 MMT, 30' TE, 10' Self care Visit#: 8  of    Re-eval:      Past Medical History:  Past Medical History  Diagnosis Date  . Hypertension    Past Surgical History: No past surgical history on file.  Subjective Symptoms/Limitations Symptoms: Pt repots that today is going to be his last treatment.  He reports he is doing his exercises at home and feels that he can continue at home.  He feels is somewhat better overall.  He would like to have a collary for his neck to hold it up , advised pt against neck brace and encouraged using his postural muscles.  Pain Assessment Pain Score: 0-No pain  Assessment Cervical AROM Cervical Extension: 9.5 cm Cervical - Right Side Bend: 17 cm Cervical - Left Side Bend: 16.5 cm  Cervical - Right Rotation: 11 cm Cervical - Left Rotation: 12 cm Cervical Strength Cervical Flexion: 4/5 Cervical Extension: 4/5 Cervical - Right Side Bend: 3+/5 Cervical - Left Side Bend: 3+/5 Cervical - Right Rotation: 3+/5 Cervical - Left Rotation: 3/5  Exercise/Treatments Neck Exercises Neck Retraction: Seated;Supine;Standing;10 reps Neck Extension: Standing;Seated;10 reps Neck Lateral Flexion - Right: Seated;10 reps Neck Lateral Flexion - Left: 10 reps;Seated Neck Rotation - Right: Seated;10 reps Neck Rotation - Left: Seated;10 reps Shoulder Extension: Both;10 reps;Theraband Theraband Level (Shoulder Extension): Level 3 (Green) Row: Both;10 reps;Theraband Theraband Level (Row): Level 3 (Green) Scapular Retraction: Right;Left;5 reps Scapular Retraction Limitations: 1 arm at a time 3x5  W Back: 15 reps;Limitations W Back Limitations: Supine. tactile cueing X to V: Seated;10 reps;Limitations X to V Limitations: and supine Additional Neck Exercises UBE (Upper  Arm Bike): 6 min backwards 2.0     Physical Therapy Assessment and Plan PT Assessment and Plan Clinical Impression Statement: Pt continues to require cueing for appropriate posture, however has a notable improvement with ROM is mostly limited by postural endurance and strength.  Updated HEP with Tband exercises and recommend to continue if he needs it in the future secondary to continued decrease in strength and soreness to R middle cervical facet region PT Plan: D/C w/advanced HEP    Goals    Problem List Patient Active Problem List  Diagnoses  . MALIGNANT NEOPLASM OF PROSTATE  . COLONIC POLYPS  . HEMORRHOIDS, INTERNAL  . EROSIVE ESOPHAGITIS  . ESOPHAGEAL STRICTURE  . Esophageal reflux  . HIATAL HERNIA  . DIVERTICULOSIS, COLON  . Neck pain  . Neck muscle weakness  . Neck stiffness    PT - End of Session Activity Tolerance: Patient tolerated treatment well   [name] 11/12/2011, 10:30 AM  Physician Documentation Your signature is required to indicate approval of the treatment plan as stated above.  Please sign and either send electronically or make a copy of this report for your files and return this physician signed original.   Please mark one 1.__approve of plan  2. ___approve of plan with the following conditions.   ______________________________                                                          _____________________ Physician Signature  Date  

## 2011-11-21 ENCOUNTER — Ambulatory Visit (HOSPITAL_COMMUNITY): Payer: Self-pay

## 2011-11-26 ENCOUNTER — Other Ambulatory Visit (HOSPITAL_COMMUNITY): Payer: Self-pay | Admitting: Internal Medicine

## 2011-11-26 DIAGNOSIS — R413 Other amnesia: Secondary | ICD-10-CM

## 2011-11-26 DIAGNOSIS — R269 Unspecified abnormalities of gait and mobility: Secondary | ICD-10-CM

## 2011-11-28 ENCOUNTER — Encounter (HOSPITAL_COMMUNITY): Payer: Self-pay | Admitting: Anesthesiology

## 2011-11-28 ENCOUNTER — Encounter (HOSPITAL_COMMUNITY)
Admission: AD | Disposition: A | Discharge status: Home / Self Care | Payer: Self-pay | Source: Ambulatory Visit | Attending: Neurosurgery

## 2011-11-28 ENCOUNTER — Encounter (HOSPITAL_COMMUNITY): Payer: Self-pay | Admitting: General Practice

## 2011-11-28 ENCOUNTER — Inpatient Hospital Stay (HOSPITAL_COMMUNITY): Payer: Medicare Other

## 2011-11-28 ENCOUNTER — Other Ambulatory Visit: Payer: Self-pay

## 2011-11-28 ENCOUNTER — Inpatient Hospital Stay (HOSPITAL_COMMUNITY): Payer: Medicare Other | Admitting: Anesthesiology

## 2011-11-28 ENCOUNTER — Ambulatory Visit (HOSPITAL_COMMUNITY)
Admission: RE | Admit: 2011-11-28 | Discharge: 2011-11-28 | Disposition: A | Discharge status: Home / Self Care | Payer: Medicare Other | Source: Ambulatory Visit | Attending: Internal Medicine | Admitting: Internal Medicine

## 2011-11-28 ENCOUNTER — Inpatient Hospital Stay (HOSPITAL_COMMUNITY)
Admission: AD | Admit: 2011-11-28 | Discharge: 2011-12-01 | DRG: 027 | Disposition: A | Discharge status: Home / Self Care | Payer: Medicare Other | Source: Ambulatory Visit | Attending: Neurosurgery | Admitting: Neurosurgery

## 2011-11-28 DIAGNOSIS — I6200 Nontraumatic subdural hemorrhage, unspecified: Principal | ICD-10-CM | POA: Diagnosis present

## 2011-11-28 DIAGNOSIS — I10 Essential (primary) hypertension: Secondary | ICD-10-CM | POA: Diagnosis present

## 2011-11-28 DIAGNOSIS — J4489 Other specified chronic obstructive pulmonary disease: Secondary | ICD-10-CM | POA: Diagnosis present

## 2011-11-28 DIAGNOSIS — J449 Chronic obstructive pulmonary disease, unspecified: Secondary | ICD-10-CM | POA: Diagnosis present

## 2011-11-28 DIAGNOSIS — Z8546 Personal history of malignant neoplasm of prostate: Secondary | ICD-10-CM

## 2011-11-28 DIAGNOSIS — Z87891 Personal history of nicotine dependence: Secondary | ICD-10-CM

## 2011-11-28 DIAGNOSIS — I6203 Nontraumatic chronic subdural hemorrhage: Secondary | ICD-10-CM | POA: Diagnosis present

## 2011-11-28 DIAGNOSIS — K219 Gastro-esophageal reflux disease without esophagitis: Secondary | ICD-10-CM | POA: Diagnosis present

## 2011-11-28 DIAGNOSIS — R413 Other amnesia: Secondary | ICD-10-CM

## 2011-11-28 DIAGNOSIS — Z79899 Other long term (current) drug therapy: Secondary | ICD-10-CM

## 2011-11-28 DIAGNOSIS — R269 Unspecified abnormalities of gait and mobility: Secondary | ICD-10-CM

## 2011-11-28 HISTORY — DX: Gastro-esophageal reflux disease without esophagitis: K21.9

## 2011-11-28 HISTORY — DX: Traumatic subdural hemorrhage with loss of consciousness status unknown, initial encounter: S06.5XAA

## 2011-11-28 HISTORY — DX: Unspecified osteoarthritis, unspecified site: M19.90

## 2011-11-28 HISTORY — DX: Traumatic subdural hemorrhage with loss of consciousness of unspecified duration, initial encounter: S06.5X9A

## 2011-11-28 HISTORY — PX: CRANIOTOMY: SHX93

## 2011-11-28 LAB — DIFFERENTIAL
Basophils Absolute: 0 10*3/uL (ref 0.0–0.1)
Basophils Relative: 1 % (ref 0–1)
Eosinophils Absolute: 0.5 10*3/uL (ref 0.0–0.7)
Eosinophils Relative: 6 % — ABNORMAL HIGH (ref 0–5)

## 2011-11-28 LAB — COMPREHENSIVE METABOLIC PANEL
Albumin: 3.7 g/dL (ref 3.5–5.2)
Alkaline Phosphatase: 75 U/L (ref 39–117)
BUN: 20 mg/dL (ref 6–23)
Chloride: 100 mEq/L (ref 96–112)
Potassium: 3.5 mEq/L (ref 3.5–5.1)
Total Bilirubin: 0.6 mg/dL (ref 0.3–1.2)

## 2011-11-28 LAB — CBC
MCH: 29.9 pg (ref 26.0–34.0)
MCV: 84.6 fL (ref 78.0–100.0)
Platelets: 196 10*3/uL (ref 150–400)
RDW: 13.3 % (ref 11.5–15.5)
WBC: 8.1 10*3/uL (ref 4.0–10.5)

## 2011-11-28 LAB — APTT: aPTT: 34 seconds (ref 24–37)

## 2011-11-28 SURGERY — CRANIOTOMY HEMATOMA EVACUATION SUBDURAL
Anesthesia: General | Site: Head | Laterality: Right | Wound class: Clean

## 2011-11-28 MED ORDER — CEFAZOLIN SODIUM 1-5 GM-% IV SOLN
INTRAVENOUS | Status: AC
  Filled 2011-11-28: qty 50

## 2011-11-28 MED ORDER — EPHEDRINE SULFATE 50 MG/ML IJ SOLN
INTRAMUSCULAR | Status: DC | PRN
  Administered 2011-11-28 (×4): 5 mg via INTRAVENOUS

## 2011-11-28 MED ORDER — ROCURONIUM BROMIDE 100 MG/10ML IV SOLN
INTRAVENOUS | Status: DC | PRN
  Administered 2011-11-28: 50 mg via INTRAVENOUS

## 2011-11-28 MED ORDER — PSYLLIUM 95 % PO PACK
1.0000 | PACK | Freq: Every day | ORAL | Status: DC
  Administered 2011-11-29 – 2011-12-01 (×3): 1 via ORAL
  Filled 2011-11-28 (×4): qty 1

## 2011-11-28 MED ORDER — PROPOFOL 10 MG/ML IV EMUL
INTRAVENOUS | Status: DC | PRN
  Administered 2011-11-28: 50 mg via INTRAVENOUS
  Administered 2011-11-28: 20 mg via INTRAVENOUS

## 2011-11-28 MED ORDER — CEFAZOLIN SODIUM 1-5 GM-% IV SOLN
INTRAVENOUS | Status: DC | PRN
  Administered 2011-11-28: 1 g via INTRAVENOUS

## 2011-11-28 MED ORDER — GLYCOPYRROLATE 0.2 MG/ML IJ SOLN
INTRAMUSCULAR | Status: DC | PRN
  Administered 2011-11-28: .8 mg via INTRAVENOUS

## 2011-11-28 MED ORDER — NEOSTIGMINE METHYLSULFATE 1 MG/ML IJ SOLN
INTRAMUSCULAR | Status: DC | PRN
  Administered 2011-11-28: 5 mg via INTRAVENOUS

## 2011-11-28 MED ORDER — LABETALOL HCL 5 MG/ML IV SOLN
INTRAVENOUS | Status: DC | PRN
  Administered 2011-11-28: 10 mg via INTRAVENOUS
  Administered 2011-11-28: 5 mg via INTRAVENOUS

## 2011-11-28 MED ORDER — THROMBIN 20000 UNITS EX KIT
PACK | CUTANEOUS | Status: DC | PRN
  Administered 2011-11-28: 20:00:00 via TOPICAL

## 2011-11-28 MED ORDER — PROMETHAZINE HCL 25 MG/ML IJ SOLN: 6.2500 mg | INTRAMUSCULAR | Status: DC | PRN

## 2011-11-28 MED ORDER — CEFAZOLIN SODIUM 1-5 GM-% IV SOLN
1.0000 g | Freq: Three times a day (TID) | INTRAVENOUS | Status: DC
  Administered 2011-11-28 – 2011-12-01 (×8): 1 g via INTRAVENOUS
  Filled 2011-11-28 (×11): qty 50

## 2011-11-28 MED ORDER — 0.9 % SODIUM CHLORIDE (POUR BTL) OPTIME
TOPICAL | Status: DC | PRN
  Administered 2011-11-28 (×2): 1000 mL

## 2011-11-28 MED ORDER — FENTANYL CITRATE 0.05 MG/ML IJ SOLN: 25.0000 ug | INTRAMUSCULAR | Status: DC | PRN

## 2011-11-28 MED ORDER — DOCUSATE SODIUM 100 MG PO CAPS
100.0000 mg | ORAL_CAPSULE | Freq: Every day | ORAL | Status: DC
  Administered 2011-11-29 – 2011-12-01 (×3): 100 mg via ORAL
  Filled 2011-11-28 (×3): qty 1

## 2011-11-28 MED ORDER — MICROFIBRILLAR COLL HEMOSTAT EX PADS
MEDICATED_PAD | CUTANEOUS | Status: DC | PRN
  Administered 2011-11-28: 1 via TOPICAL

## 2011-11-28 MED ORDER — SODIUM CHLORIDE 0.9 % IV SOLN
INTRAVENOUS | Status: DC | PRN
  Administered 2011-11-28 (×2): via INTRAVENOUS

## 2011-11-28 MED ORDER — MORPHINE SULFATE 2 MG/ML IJ SOLN
2.0000 mg | INTRAMUSCULAR | Status: DC | PRN
  Administered 2011-11-29 (×2): 2 mg via INTRAVENOUS
  Filled 2011-11-28 (×2): qty 1

## 2011-11-28 MED ORDER — SODIUM CHLORIDE 0.9 % IV SOLN
INTRAVENOUS | Status: DC
  Administered 2011-11-28 – 2011-11-29 (×2): via INTRAVENOUS

## 2011-11-28 MED ORDER — PHENYLEPHRINE HCL 10 MG/ML IJ SOLN
INTRAMUSCULAR | Status: DC | PRN
  Administered 2011-11-28 (×2): 80 ug via INTRAVENOUS

## 2011-11-28 MED ORDER — ONDANSETRON HCL 4 MG/2ML IJ SOLN
2.0000 mg | Freq: Four times a day (QID) | INTRAMUSCULAR | Status: DC | PRN
  Administered 2011-11-28 – 2011-11-30 (×3): 4 mg via INTRAVENOUS
  Filled 2011-11-28 (×3): qty 2

## 2011-11-28 MED ORDER — PANTOPRAZOLE SODIUM 40 MG PO TBEC
40.0000 mg | DELAYED_RELEASE_TABLET | Freq: Every day | ORAL | Status: DC
  Administered 2011-11-29 – 2011-12-01 (×2): 40 mg via ORAL
  Filled 2011-11-28 (×2): qty 1

## 2011-11-28 MED ORDER — ONDANSETRON HCL 4 MG/2ML IJ SOLN
INTRAMUSCULAR | Status: DC | PRN
  Administered 2011-11-28: 4 mg via INTRAVENOUS

## 2011-11-28 MED ORDER — POTASSIUM CHLORIDE IN NACL 20-0.9 MEQ/L-% IV SOLN
INTRAVENOUS | Status: DC
  Administered 2011-11-28 – 2011-12-01 (×5): via INTRAVENOUS
  Filled 2011-11-28 (×7): qty 1000

## 2011-11-28 MED ORDER — SUFENTANIL CITRATE 50 MCG/ML IV SOLN
INTRAVENOUS | Status: DC | PRN
  Administered 2011-11-28: 30 ug via INTRAVENOUS

## 2011-11-28 MED ORDER — HYDROCODONE-ACETAMINOPHEN 5-325 MG PO TABS
1.0000 | ORAL_TABLET | ORAL | Status: DC | PRN
  Administered 2011-11-29 (×2): 1 via ORAL
  Administered 2011-11-29 – 2011-11-30 (×2): 2 via ORAL
  Filled 2011-11-28 (×2): qty 1
  Filled 2011-11-28 (×2): qty 2

## 2011-11-28 MED ORDER — LIDOCAINE-EPINEPHRINE 0.5-1:200000 % IJ SOLN
INTRAMUSCULAR | Status: DC | PRN
  Administered 2011-11-28: 10 mL

## 2011-11-28 SURGICAL SUPPLY — 47 items
BUR ACORN 6.0 PRECISION (BURR) ×1 IMPLANT
BUR MATCHSTICK NEURO 3.0X3.8 (BURR) ×1 IMPLANT
BUR ROUTER D-58 CRANI (BURR) ×1 IMPLANT
CANISTER SUCTION 2500CC (MISCELLANEOUS) ×2 IMPLANT
CATH ROBINSON RED A/P 14FR (CATHETERS) ×1 IMPLANT
CLOTH BEACON ORANGE TIMEOUT ST (SAFETY) ×1 IMPLANT
CONT SPEC 4OZ CLIKSEAL STRL BL (MISCELLANEOUS) ×1 IMPLANT
CORDS BIPOLAR (ELECTRODE) ×1 IMPLANT
DRAPE NEUROLOGICAL W/INCISE (DRAPES) ×1 IMPLANT
DRAPE WARM FLUID 44X44 (DRAPE) ×1 IMPLANT
DRESSING TELFA 8X3 (GAUZE/BANDAGES/DRESSINGS) ×1 IMPLANT
DRSG OPSITE 4X5.5 SM (GAUZE/BANDAGES/DRESSINGS) ×1 IMPLANT
DURAPREP 6ML APPLICATOR 50/CS (WOUND CARE) ×1 IMPLANT
ELECT CAUTERY BLADE 6.4 (BLADE) ×1 IMPLANT
ELECT REM PT RETURN 9FT ADLT (ELECTROSURGICAL) ×2
ELECTRODE REM PT RTRN 9FT ADLT (ELECTROSURGICAL) IMPLANT
EVACUATOR 1/8 PVC DRAIN (DRAIN) ×1 IMPLANT
EVACUATOR SILICONE 100CC (DRAIN) ×1 IMPLANT
GLOVE BIOGEL M 6.5 STRL (GLOVE) ×4 IMPLANT
GLOVE BIOGEL M 8.0 STRL (GLOVE) ×1 IMPLANT
GLOVE BIOGEL PI IND STRL 6.5 (GLOVE) IMPLANT
GLOVE BIOGEL PI IND STRL 8 (GLOVE) IMPLANT
GLOVE BIOGEL PI INDICATOR 6.5 (GLOVE) ×2
GLOVE BIOGEL PI INDICATOR 8 (GLOVE) ×2
GLOVE ECLIPSE 7.5 STRL STRAW (GLOVE) ×2 IMPLANT
GOWN BRE IMP SLV AUR LG STRL (GOWN DISPOSABLE) ×4 IMPLANT
GOWN STRL REIN 2XL LVL4 (GOWN DISPOSABLE) ×2 IMPLANT
KIT BASIN OR (CUSTOM PROCEDURE TRAY) ×1 IMPLANT
KIT ROOM TURNOVER OR (KITS) ×1 IMPLANT
NDL HYPO 25X1 1.5 SAFETY (NEEDLE) IMPLANT
NEEDLE HYPO 25X1 1.5 SAFETY (NEEDLE) ×2 IMPLANT
PACK CRANIOTOMY (CUSTOM PROCEDURE TRAY) ×1 IMPLANT
PAD ARMBOARD 7.5X6 YLW CONV (MISCELLANEOUS) ×3 IMPLANT
PLATE 1.5  2HOLE MED NEURO (Plate) ×2 IMPLANT
PLATE 1.5 2HOLE MED NEURO (Plate) IMPLANT
PLATE 1.5/0.5 18.5MM BURR HOLE (Plate) ×1 IMPLANT
SCREW SELF DRILL HT 1.5/4MM (Screw) ×8 IMPLANT
STAPLER SKIN PROX WIDE 3.9 (STAPLE) ×1 IMPLANT
SUT ETHILON 3 0 FSL (SUTURE) ×1 IMPLANT
SUT NURALON 4 0 TR CR/8 (SUTURE) ×2 IMPLANT
SUT VIC AB 2-0 CT2 18 VCP726D (SUTURE) ×1 IMPLANT
SYR 20ML ECCENTRIC (SYRINGE) ×1 IMPLANT
SYR CONTROL 10ML LL (SYRINGE) ×1 IMPLANT
TAPE CLOTH SURG 4X10 WHT LF (GAUZE/BANDAGES/DRESSINGS) ×1 IMPLANT
TOWEL OR 17X26 10 PK STRL BLUE (TOWEL DISPOSABLE) ×2 IMPLANT
TRAY FOLEY CATH 14FRSI W/METER (CATHETERS) ×1 IMPLANT
WATER STERILE IRR 1000ML POUR (IV SOLUTION) ×1 IMPLANT

## 2011-11-28 NOTE — Transfer of Care (Signed)
Immediate Anesthesia Transfer of Care Note  Patient: Glenn Bean  Procedure(s) Performed:  CRANIOTOMY HEMATOMA EVACUATION SUBDURAL - Right Craniotomy for Evacuation of Subdural Hematoma  Patient Location: ICU  Anesthesia Type: General  Level of Consciousness: awake and alert   Airway & Oxygen Therapy: Patient Spontanous Breathing and Patient connected to face mask oxygen  Post-op Assessment: Report given to PACU RN and Post -op Vital signs reviewed and stable  Post vital signs: Reviewed and stable  Complications: No apparent anesthesia complications
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
## 2011-11-28 NOTE — Anesthesia Postprocedure Evaluation (Signed)
  Anesthesia Post-op Note  Patient: Glenn Bean  Procedure(s) Performed:  CRANIOTOMY HEMATOMA EVACUATION SUBDURAL - Right Craniotomy for Evacuation of Subdural Hematoma  Patient Location: ICU  Anesthesia Type: General  Level of Consciousness: awake, alert  and oriented  Airway and Oxygen Therapy: Patient Spontanous Breathing and Patient connected to nasal cannula oxygen  Post-op Pain: none  Post-op Assessment: Post-op Vital signs reviewed, Patient's Cardiovascular Status Stable, Respiratory Function Stable, Patent Airway, No signs of Nausea or vomiting and Pain level controlled  Post-op Vital Signs: Reviewed and stable  Complications: No apparent anesthesia complications

## 2011-11-28 NOTE — Preoperative (Addendum)
Beta Blockers   Reason not to administer Beta Blockers:Not Applicable 

## 2011-11-28 NOTE — Op Note (Signed)
11/28/2011  8:42 PM  PATIENT:  Glenn Bean  77 y.o. male  PRE-OPERATIVE DIAGNOSIS: right panhemispheric Chronic Subdural  POST-OPERATIVE DIAGNOSIS:right panhemispheric  Chronic Subdural  PROCEDURE:  Procedure(s): right parietal  CRANIOTOMY HEMATOMA EVACUATION SUBDURAL  SURGEON:  Surgeon(s): Ronaldo Miyamoto L Evia Goldsmith Karn Cassis  ASSISTANTS:botero  ANESTHESIA:   general  EBL:  Total I/O In: 1000 [I.V.:1000] Out: 50 [Blood:50]  BLOOD ADMINISTERED:none  CELL SAVER GIVEN:none  COUNT:per nursing  DRAINS: (1) Jackson-Pratt drain(s) with closed bulb suction in the right subdural space   SPECIMEN:  No Specimen  DICTATION: [name] was taken to the operating room intubated and placed under a general anesthetic. A foley catheter was placed under sterile conditions. After adequate anesthesia was obtained I placed a three pin mayfield headholder on his head, applying ~60lbs of pressure. With his head secured in the [facility] we placed shoulder roll and I turned his head so that it was parallel to the floor. I shaved his head, it was then prepped with dura prep. I made an outline of my incision along the R parietal boss. I infiltrated with lidocaine the proposed incision. I opened the incision with a No. 10 blade. I placed raney clips along the edges to control scalp bleeding. I drilled two burr holes and connected them with the craniotome. I elevated the free bone flap and gave it to my scrub. I opened the dura and immediately had dark brown fluid emanate from the opening. I irrigated the subdural space. After egress of the liquified blood I did open a membrane overlying the brain surface.I irrigated underneath this membrane. When my irrigant was clear i place a jp drain in the subdural space and tunneled it out through a stab incision in the scalp. I with Dr. Cassandria Santee assistance closed the dura loosely allowing for the drain. We the replaced the bone flap with plates and screws, checking that  the drain was mobile. We then closed the galea with vicryl sutures and the scalp with staples. A sterile dressing was applied. The drain was secured to the scalp. [name] was extubated and taken to the NICU for recovery.  PLAN OF CARE: Admit to inpatient   PATIENT DISPOSITION:  ICU - extubated and stable.   Delay start of Pharmacological VTE agent (>24hrs) due to surgical blood loss or risk of bleeding:  yes

## 2011-11-28 NOTE — Progress Notes (Signed)
BP 129/74  Pulse 70  Temp 97.8 F (36.6 C)  Resp 16  SpO2 97% Mr. Glenn Bean is alert, oriented x4 with clear and fluent speech. Peerl, full EOM Symmetric facies, tongue and uvula midline.  Moving all extremities well. Drain in place. Dressing clean dry no signs of infection. Doing well post op

## 2011-11-28 NOTE — Progress Notes (Signed)
Patient is alert and oriented X3, denies pain at this time.  MD notified of patient arrival.  Patient reports last eating and drinking at 0600.  Will continue to monitor.  Osvaldo Angst, RN
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
## 2011-11-28 NOTE — Anesthesia Procedure Notes (Signed)
Procedure Name: Intubation Date/Time: 11/28/2011 7:18 PM Performed by: Glendora Score Pre-anesthesia Checklist: Patient identified, Emergency Drugs available, Suction available and Patient being monitored Patient Re-evaluated:Patient Re-evaluated prior to inductionOxygen Delivery Method: Circle System Utilized Preoxygenation: Pre-oxygenation with 100% oxygen Intubation Type: IV induction Ventilation: Mask ventilation without difficulty and Oral airway inserted - appropriate to patient size Laryngoscope Size: Hyacinth Meeker and 2 Grade View: Grade I Tube type: Oral Tube size: 8.0 mm Number of attempts: 1 Airway Equipment and Method: stylet Placement Confirmation: ETT inserted through vocal cords under direct vision,  positive ETCO2 and breath sounds checked- equal and bilateral Secured at: 22 cm Tube secured with: Tape Dental Injury: Teeth and Oropharynx as per pre-operative assessment

## 2011-11-29 ENCOUNTER — Inpatient Hospital Stay (HOSPITAL_COMMUNITY): Payer: Medicare Other

## 2011-11-29 LAB — MRSA PCR SCREENING: MRSA by PCR: NEGATIVE
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
## 2011-11-29 NOTE — Plan of Care (Signed)
Problem: Consults Goal: Diagnosis - Craniotomy Outcome: Completed/Met Date Met:  11/29/11 Subdural hematoma

## 2011-11-30 MED ORDER — ACETAMINOPHEN 325 MG PO TABS
650.0000 mg | ORAL_TABLET | ORAL | Status: DC | PRN
  Administered 2011-11-30: 650 mg via ORAL
  Filled 2011-11-30 (×2): qty 2

## 2011-11-30 MED ORDER — ALUM & MAG HYDROXIDE-SIMETH 200-200-20 MG/5ML PO SUSP
30.0000 mL | ORAL | Status: DC | PRN
  Filled 2011-11-30: qty 30
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
## 2011-12-01 NOTE — Progress Notes (Signed)
Patient ID: Glenn Bean, male   DOB: 25-Nov-1933, 77 y.o.   MRN: [id number] Stable. Wound dry ,no c/o pla dc today

## 2011-12-01 NOTE — Progress Notes (Signed)
Patient given discharge instructions and prescription, patient and spouse verbalize understanding of discharge education and instructions. Patient discharged home with wife at 7

## 2011-12-04 ENCOUNTER — Encounter (HOSPITAL_COMMUNITY): Payer: Self-pay | Admitting: Neurosurgery
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
## 2011-12-22 DIAGNOSIS — I10 Essential (primary) hypertension: Secondary | ICD-10-CM | POA: Diagnosis not present

## 2011-12-22 DIAGNOSIS — C61 Malignant neoplasm of prostate: Secondary | ICD-10-CM | POA: Diagnosis not present

## 2011-12-22 DIAGNOSIS — E119 Type 2 diabetes mellitus without complications: Secondary | ICD-10-CM | POA: Diagnosis not present

## 2011-12-22 DIAGNOSIS — Z79899 Other long term (current) drug therapy: Secondary | ICD-10-CM | POA: Diagnosis not present

## 2012-01-06 DIAGNOSIS — I10 Essential (primary) hypertension: Secondary | ICD-10-CM | POA: Diagnosis not present

## 2012-01-06 DIAGNOSIS — E785 Hyperlipidemia, unspecified: Secondary | ICD-10-CM | POA: Diagnosis not present

## 2012-01-06 DIAGNOSIS — S065X0A Traumatic subdural hemorrhage without loss of consciousness, initial encounter: Secondary | ICD-10-CM | POA: Diagnosis not present

## 2012-05-21 DIAGNOSIS — L570 Actinic keratosis: Secondary | ICD-10-CM | POA: Diagnosis not present

## 2012-05-21 DIAGNOSIS — L821 Other seborrheic keratosis: Secondary | ICD-10-CM | POA: Diagnosis not present

## 2012-05-28 DIAGNOSIS — C61 Malignant neoplasm of prostate: Secondary | ICD-10-CM | POA: Diagnosis not present

## 2012-06-17 DIAGNOSIS — C61 Malignant neoplasm of prostate: Secondary | ICD-10-CM | POA: Diagnosis not present

## 2012-06-25 ENCOUNTER — Other Ambulatory Visit: Payer: Self-pay | Admitting: Gastroenterology

## 2012-06-25 ENCOUNTER — Telehealth: Payer: Self-pay | Admitting: Gastroenterology

## 2012-06-25 MED ORDER — OMEPRAZOLE 20 MG PO CPDR: 20.0000 mg | DELAYED_RELEASE_CAPSULE | Freq: Every day | ORAL | Status: DC

## 2012-07-05 DIAGNOSIS — C61 Malignant neoplasm of prostate: Secondary | ICD-10-CM | POA: Diagnosis not present

## 2012-07-05 DIAGNOSIS — I10 Essential (primary) hypertension: Secondary | ICD-10-CM | POA: Diagnosis not present

## 2012-07-20 DIAGNOSIS — IMO0002 Reserved for concepts with insufficient information to code with codable children: Secondary | ICD-10-CM | POA: Diagnosis not present

## 2012-08-04 MED ORDER — OMEPRAZOLE 20 MG PO CPDR: 20.0000 mg | DELAYED_RELEASE_CAPSULE | Freq: Every day | ORAL | Status: DC

## 2012-08-04 NOTE — Telephone Encounter (Signed)
Sent prilosec to [facility]. lvm for pt letting him know.

## 2012-09-01 ENCOUNTER — Other Ambulatory Visit: Payer: Self-pay | Admitting: Gastroenterology

## 2012-09-01 MED ORDER — OMEPRAZOLE 20 MG PO CPDR: 20.0000 mg | DELAYED_RELEASE_CAPSULE | Freq: Every day | ORAL | Status: DC

## 2012-09-01 NOTE — Telephone Encounter (Signed)
Patient notified I sent in one refill but he needs to schedule an appointment.

## 2012-09-24 ENCOUNTER — Ambulatory Visit (INDEPENDENT_AMBULATORY_CARE_PROVIDER_SITE_OTHER): Payer: Medicare Other | Admitting: Gastroenterology

## 2012-09-24 ENCOUNTER — Telehealth: Payer: Self-pay | Admitting: Gastroenterology

## 2012-09-24 ENCOUNTER — Encounter: Payer: Self-pay | Admitting: Gastroenterology

## 2012-09-24 VITALS — BP 120/80 | HR 64 | Ht 70.5 in | Wt 190.2 lb

## 2012-09-24 DIAGNOSIS — K5730 Diverticulosis of large intestine without perforation or abscess without bleeding: Secondary | ICD-10-CM | POA: Diagnosis not present

## 2012-09-24 DIAGNOSIS — K219 Gastro-esophageal reflux disease without esophagitis: Secondary | ICD-10-CM | POA: Diagnosis not present

## 2012-09-24 DIAGNOSIS — Z860100 Personal history of colon polyps, unspecified: Secondary | ICD-10-CM

## 2012-09-24 MED ORDER — OMEPRAZOLE 20 MG PO CPDR: 20.0000 mg | DELAYED_RELEASE_CAPSULE | Freq: Every day | ORAL | Status: DC

## 2012-09-24 NOTE — Patient Instructions (Addendum)
Your physician has requested that you go to the basement for the following lab work before leaving today: IFOB We have sent the following medications to your pharmacy for you to pick up at your convenience: Prilosec CC:  Glenn Bean

## 2012-09-24 NOTE — Telephone Encounter (Signed)
Error

## 2012-09-24 NOTE — Telephone Encounter (Signed)
rx sent

## 2012-09-24 NOTE — Progress Notes (Signed)
This is a 78 year old Caucasian male with chronic acid reflux well controlled on daily Prilosec 20 mg. He denies acid reflux symptoms or dysphagia or any hepatobiliary symptomatology. He has diverticulosis coli with regular bowel movements on daily Metamucil. Last colonoscopy 3 years ago was unremarkable otherwise except for small vascular non- adenomatous polyp removed. Patient's appetite is good and his weight is stable. He is followed clinically by Dr. Ouida Sills.  Current Medications, Allergies, Past Medical History, Past Surgical History, Family History and Social History were reviewed in [employer] record.  Pertinent Review of Systems Negative   Physical Exam: Healthy-appearing patient in no distress. Blood pressure 120/80, pulse 64 and regular, and weight under 90 pounds with a BMI of 26.91. I cannot appreciate stigmata of chronic liver disease. Chest is clear, there are scattered anterior wheezes noted. and he appears to be in a regular rhythm without murmurs gallops or rubs. There is no hepatosplenomegaly, abdominal masses, tenderness, and bowel sounds are normal. Mental status is normal.    Assessment and Plan: Continue current medications as listed. I do not think this patient needs followup colonoscopy at this time. I have asked him to do IFOB stool cards for occult blood. We will see him on a when necessary basis as needed. Encounter Diagnosis  Name Primary?  . History of colon polyps Yes

## 2012-09-29 ENCOUNTER — Other Ambulatory Visit: Payer: Medicare Other

## 2012-09-29 DIAGNOSIS — Z86010 Personal history of colonic polyps: Secondary | ICD-10-CM

## 2012-09-29 LAB — FECAL OCCULT BLOOD, IMMUNOCHEMICAL: Fecal Occult Bld: NEGATIVE

## 2013-02-01 DIAGNOSIS — E781 Pure hyperglyceridemia: Secondary | ICD-10-CM | POA: Diagnosis not present

## 2013-02-01 DIAGNOSIS — Z79899 Other long term (current) drug therapy: Secondary | ICD-10-CM | POA: Diagnosis not present

## 2013-02-01 DIAGNOSIS — Z125 Encounter for screening for malignant neoplasm of prostate: Secondary | ICD-10-CM | POA: Diagnosis not present

## 2013-02-17 DIAGNOSIS — E785 Hyperlipidemia, unspecified: Secondary | ICD-10-CM | POA: Diagnosis not present

## 2013-02-17 DIAGNOSIS — Z1212 Encounter for screening for malignant neoplasm of rectum: Secondary | ICD-10-CM | POA: Diagnosis not present

## 2013-02-17 DIAGNOSIS — I10 Essential (primary) hypertension: Secondary | ICD-10-CM | POA: Diagnosis not present

## 2013-04-28 ENCOUNTER — Other Ambulatory Visit (HOSPITAL_COMMUNITY): Payer: Self-pay | Admitting: Neurosurgery

## 2013-04-28 ENCOUNTER — Ambulatory Visit (HOSPITAL_COMMUNITY)
Admission: RE | Admit: 2013-04-28 | Discharge: 2013-04-28 | Disposition: A | Discharge status: Home / Self Care | Payer: Medicare Other | Source: Ambulatory Visit | Attending: Neurosurgery | Admitting: Neurosurgery

## 2013-04-28 DIAGNOSIS — G319 Degenerative disease of nervous system, unspecified: Secondary | ICD-10-CM | POA: Insufficient documentation

## 2013-04-28 DIAGNOSIS — I6200 Nontraumatic subdural hemorrhage, unspecified: Secondary | ICD-10-CM | POA: Diagnosis not present

## 2013-04-28 DIAGNOSIS — M5030 Other cervical disc degeneration, unspecified cervical region: Secondary | ICD-10-CM | POA: Diagnosis not present

## 2013-04-28 DIAGNOSIS — S065X9A Traumatic subdural hemorrhage with loss of consciousness of unspecified duration, initial encounter: Secondary | ICD-10-CM

## 2013-06-14 DIAGNOSIS — C61 Malignant neoplasm of prostate: Secondary | ICD-10-CM | POA: Diagnosis not present

## 2013-08-24 DIAGNOSIS — I10 Essential (primary) hypertension: Secondary | ICD-10-CM | POA: Diagnosis not present

## 2013-11-25 ENCOUNTER — Telehealth: Payer: Self-pay | Admitting: Gastroenterology

## 2013-11-25 MED ORDER — OMEPRAZOLE 20 MG PO CPDR: 20.0000 mg | DELAYED_RELEASE_CAPSULE | Freq: Every day | ORAL | Status: DC

## 2013-11-25 NOTE — Telephone Encounter (Signed)
RX SENT

## 2013-12-23 DIAGNOSIS — Z23 Encounter for immunization: Secondary | ICD-10-CM | POA: Diagnosis not present

## 2014-01-19 ENCOUNTER — Encounter (HOSPITAL_COMMUNITY): Payer: Self-pay | Admitting: Emergency Medicine

## 2014-01-19 ENCOUNTER — Emergency Department (HOSPITAL_COMMUNITY): Payer: Medicare Other

## 2014-01-19 ENCOUNTER — Emergency Department (HOSPITAL_COMMUNITY)
Admission: EM | Admit: 2014-01-19 | Discharge: 2014-01-19 | Disposition: A | Discharge status: Home / Self Care | Payer: Medicare Other | Attending: Emergency Medicine | Admitting: Emergency Medicine

## 2014-01-19 DIAGNOSIS — K219 Gastro-esophageal reflux disease without esophagitis: Secondary | ICD-10-CM | POA: Diagnosis not present

## 2014-01-19 DIAGNOSIS — M25569 Pain in unspecified knee: Secondary | ICD-10-CM | POA: Diagnosis not present

## 2014-01-19 DIAGNOSIS — IMO0002 Reserved for concepts with insufficient information to code with codable children: Secondary | ICD-10-CM | POA: Diagnosis not present

## 2014-01-19 DIAGNOSIS — M25561 Pain in right knee: Secondary | ICD-10-CM

## 2014-01-19 DIAGNOSIS — Z87891 Personal history of nicotine dependence: Secondary | ICD-10-CM | POA: Diagnosis not present

## 2014-01-19 DIAGNOSIS — I10 Essential (primary) hypertension: Secondary | ICD-10-CM | POA: Insufficient documentation

## 2014-01-19 DIAGNOSIS — Z87828 Personal history of other (healed) physical injury and trauma: Secondary | ICD-10-CM | POA: Insufficient documentation

## 2014-01-19 DIAGNOSIS — Z7982 Long term (current) use of aspirin: Secondary | ICD-10-CM | POA: Diagnosis not present

## 2014-01-19 DIAGNOSIS — Z79899 Other long term (current) drug therapy: Secondary | ICD-10-CM | POA: Insufficient documentation

## 2014-01-19 DIAGNOSIS — M129 Arthropathy, unspecified: Secondary | ICD-10-CM | POA: Diagnosis not present

## 2014-01-19 DIAGNOSIS — M1710 Unilateral primary osteoarthritis, unspecified knee: Secondary | ICD-10-CM | POA: Diagnosis not present

## 2014-01-19 MED ORDER — PREDNISONE 10 MG PO TABS
60.0000 mg | ORAL_TABLET | Freq: Once | ORAL | Status: AC
  Administered 2014-01-19: 60 mg via ORAL
  Filled 2014-01-19 (×2): qty 1

## 2014-01-19 MED ORDER — LIDOCAINE-EPINEPHRINE (PF) 2 %-1:200000 IJ SOLN
20.0000 mL | Freq: Once | INTRAMUSCULAR | Status: AC
  Administered 2014-01-19: 20 mL

## 2014-01-19 MED ORDER — PREDNISONE 20 MG PO TABS: ORAL_TABLET | ORAL | Status: DC

## 2014-01-19 MED ORDER — DOUBLE ANTIBIOTIC 500-10000 UNIT/GM EX OINT
TOPICAL_OINTMENT | CUTANEOUS | Status: AC
Start: 2014-01-19 — End: 2014-01-19
  Administered 2014-01-19: 1
  Filled 2014-01-19: qty 1

## 2014-01-19 MED ORDER — NAPROXEN 500 MG PO TABS: 500.0000 mg | ORAL_TABLET | Freq: Two times a day (BID) | ORAL | Status: DC

## 2014-01-19 MED ORDER — LIDOCAINE HCL (PF) 2 % IJ SOLN
INTRAMUSCULAR | Status: AC
  Filled 2014-01-19: qty 10

## 2014-01-19 MED ORDER — NAPROXEN 250 MG PO TABS
500.0000 mg | ORAL_TABLET | Freq: Once | ORAL | Status: AC
  Administered 2014-01-19: 500 mg via ORAL
  Filled 2014-01-19: qty 2

## 2014-01-19 NOTE — ED Notes (Signed)
Patient given discharge instruction, verbalized understand. Patient ambulatory out of the department.  

## 2014-01-19 NOTE — Discharge Instructions (Signed)
Ice, elevate, medication for pain and inflammation. Will need to followup with orthopedic doctor if not getting better

## 2014-01-19 NOTE — ED Notes (Signed)
Applied Bacitracin to knee and bandaid

## 2014-01-19 NOTE — ED Notes (Signed)
Pt sent from [facility] for rule out septic joint in r knee.  Pt reports knee red, swollen, and painful since Monday morning.  Denies injury.
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
## 2014-02-17 DIAGNOSIS — K219 Gastro-esophageal reflux disease without esophagitis: Secondary | ICD-10-CM | POA: Diagnosis not present

## 2014-02-17 DIAGNOSIS — E781 Pure hyperglyceridemia: Secondary | ICD-10-CM | POA: Diagnosis not present

## 2014-02-17 DIAGNOSIS — R7301 Impaired fasting glucose: Secondary | ICD-10-CM | POA: Diagnosis not present

## 2014-02-17 DIAGNOSIS — Z125 Encounter for screening for malignant neoplasm of prostate: Secondary | ICD-10-CM | POA: Diagnosis not present

## 2014-02-17 DIAGNOSIS — I10 Essential (primary) hypertension: Secondary | ICD-10-CM | POA: Diagnosis not present

## 2014-02-17 DIAGNOSIS — Z79899 Other long term (current) drug therapy: Secondary | ICD-10-CM | POA: Diagnosis not present

## 2014-03-17 DIAGNOSIS — Z0000 Encounter for general adult medical examination without abnormal findings: Secondary | ICD-10-CM | POA: Diagnosis not present

## 2014-04-19 DIAGNOSIS — H5200 Hypermetropia, unspecified eye: Secondary | ICD-10-CM | POA: Diagnosis not present

## 2014-04-19 DIAGNOSIS — H2589 Other age-related cataract: Secondary | ICD-10-CM | POA: Diagnosis not present

## 2014-04-19 DIAGNOSIS — H52229 Regular astigmatism, unspecified eye: Secondary | ICD-10-CM | POA: Diagnosis not present

## 2014-04-19 DIAGNOSIS — H348392 Tributary (branch) retinal vein occlusion, unspecified eye, stable: Secondary | ICD-10-CM | POA: Diagnosis not present

## 2014-06-13 ENCOUNTER — Other Ambulatory Visit (HOSPITAL_COMMUNITY): Payer: Self-pay | Admitting: Preventative Medicine

## 2014-06-13 ENCOUNTER — Ambulatory Visit (HOSPITAL_COMMUNITY)
Admission: RE | Admit: 2014-06-13 | Discharge: 2014-06-13 | Disposition: A | Discharge status: Home / Self Care | Payer: Worker's Compensation | Source: Ambulatory Visit | Attending: Preventative Medicine | Admitting: Preventative Medicine

## 2014-06-13 DIAGNOSIS — T148XXA Other injury of unspecified body region, initial encounter: Secondary | ICD-10-CM | POA: Insufficient documentation

## 2014-06-13 DIAGNOSIS — F29 Unspecified psychosis not due to a substance or known physiological condition: Secondary | ICD-10-CM | POA: Diagnosis present

## 2014-06-13 DIAGNOSIS — R410 Disorientation, unspecified: Secondary | ICD-10-CM

## 2014-06-13 DIAGNOSIS — W19XXXA Unspecified fall, initial encounter: Secondary | ICD-10-CM | POA: Diagnosis not present

## 2014-06-21 ENCOUNTER — Encounter: Payer: Self-pay | Admitting: Gastroenterology

## 2014-09-22 DIAGNOSIS — I10 Essential (primary) hypertension: Secondary | ICD-10-CM | POA: Diagnosis not present

## 2014-09-22 DIAGNOSIS — G4710 Hypersomnia, unspecified: Secondary | ICD-10-CM | POA: Diagnosis not present

## 2014-09-22 DIAGNOSIS — M47012 Anterior spinal artery compression syndromes, cervical region: Secondary | ICD-10-CM | POA: Diagnosis not present

## 2014-10-11 DIAGNOSIS — S46102A Unspecified injury of muscle, fascia and tendon of long head of biceps, left arm, initial encounter: Secondary | ICD-10-CM | POA: Diagnosis not present

## 2015-02-06 DIAGNOSIS — H02839 Dermatochalasis of unspecified eye, unspecified eyelid: Secondary | ICD-10-CM | POA: Diagnosis not present

## 2015-02-06 DIAGNOSIS — H18411 Arcus senilis, right eye: Secondary | ICD-10-CM | POA: Diagnosis not present

## 2015-02-06 DIAGNOSIS — H2512 Age-related nuclear cataract, left eye: Secondary | ICD-10-CM | POA: Diagnosis not present

## 2015-02-06 DIAGNOSIS — H2511 Age-related nuclear cataract, right eye: Secondary | ICD-10-CM | POA: Diagnosis not present

## 2015-03-19 DIAGNOSIS — H2511 Age-related nuclear cataract, right eye: Secondary | ICD-10-CM | POA: Diagnosis not present

## 2015-03-19 DIAGNOSIS — H25811 Combined forms of age-related cataract, right eye: Secondary | ICD-10-CM | POA: Diagnosis not present

## 2015-03-20 DIAGNOSIS — H2512 Age-related nuclear cataract, left eye: Secondary | ICD-10-CM | POA: Diagnosis not present

## 2015-03-23 DIAGNOSIS — Z125 Encounter for screening for malignant neoplasm of prostate: Secondary | ICD-10-CM | POA: Diagnosis not present

## 2015-03-23 DIAGNOSIS — E785 Hyperlipidemia, unspecified: Secondary | ICD-10-CM | POA: Diagnosis not present

## 2015-03-23 DIAGNOSIS — K219 Gastro-esophageal reflux disease without esophagitis: Secondary | ICD-10-CM | POA: Diagnosis not present

## 2015-03-23 DIAGNOSIS — I10 Essential (primary) hypertension: Secondary | ICD-10-CM | POA: Diagnosis not present

## 2015-03-23 DIAGNOSIS — D075 Carcinoma in situ of prostate: Secondary | ICD-10-CM | POA: Diagnosis not present

## 2015-03-23 DIAGNOSIS — Z79899 Other long term (current) drug therapy: Secondary | ICD-10-CM | POA: Diagnosis not present

## 2015-03-30 DIAGNOSIS — N183 Chronic kidney disease, stage 3 (moderate): Secondary | ICD-10-CM | POA: Diagnosis not present

## 2015-03-30 DIAGNOSIS — I10 Essential (primary) hypertension: Secondary | ICD-10-CM | POA: Diagnosis not present

## 2015-03-30 DIAGNOSIS — T85511A Breakdown (mechanical) of esophageal anti-reflux device, initial encounter: Secondary | ICD-10-CM | POA: Diagnosis not present

## 2015-03-30 DIAGNOSIS — Z8546 Personal history of malignant neoplasm of prostate: Secondary | ICD-10-CM | POA: Diagnosis not present

## 2015-04-02 DIAGNOSIS — H2512 Age-related nuclear cataract, left eye: Secondary | ICD-10-CM | POA: Diagnosis not present

## 2015-04-02 DIAGNOSIS — H25812 Combined forms of age-related cataract, left eye: Secondary | ICD-10-CM | POA: Diagnosis not present

## 2015-04-26 ENCOUNTER — Ambulatory Visit (INDEPENDENT_AMBULATORY_CARE_PROVIDER_SITE_OTHER): Payer: Medicare Other | Admitting: Otolaryngology

## 2015-04-26 DIAGNOSIS — H6123 Impacted cerumen, bilateral: Secondary | ICD-10-CM

## 2015-04-26 DIAGNOSIS — H903 Sensorineural hearing loss, bilateral: Secondary | ICD-10-CM

## 2015-08-14 DIAGNOSIS — M7020 Olecranon bursitis, unspecified elbow: Secondary | ICD-10-CM | POA: Diagnosis not present

## 2015-08-14 DIAGNOSIS — Z6826 Body mass index (BMI) 26.0-26.9, adult: Secondary | ICD-10-CM | POA: Diagnosis not present

## 2015-08-17 DIAGNOSIS — M7020 Olecranon bursitis, unspecified elbow: Secondary | ICD-10-CM | POA: Diagnosis not present

## 2015-09-21 DIAGNOSIS — Z79899 Other long term (current) drug therapy: Secondary | ICD-10-CM | POA: Diagnosis not present

## 2015-09-21 DIAGNOSIS — R7301 Impaired fasting glucose: Secondary | ICD-10-CM | POA: Diagnosis not present

## 2015-09-21 DIAGNOSIS — K219 Gastro-esophageal reflux disease without esophagitis: Secondary | ICD-10-CM | POA: Diagnosis not present

## 2015-09-21 DIAGNOSIS — I10 Essential (primary) hypertension: Secondary | ICD-10-CM | POA: Diagnosis not present

## 2015-09-28 DIAGNOSIS — N183 Chronic kidney disease, stage 3 (moderate): Secondary | ICD-10-CM | POA: Diagnosis not present

## 2015-09-28 DIAGNOSIS — Z6825 Body mass index (BMI) 25.0-25.9, adult: Secondary | ICD-10-CM | POA: Diagnosis not present

## 2015-09-28 DIAGNOSIS — I10 Essential (primary) hypertension: Secondary | ICD-10-CM | POA: Diagnosis not present

## 2016-03-28 DIAGNOSIS — Z79899 Other long term (current) drug therapy: Secondary | ICD-10-CM | POA: Diagnosis not present

## 2016-03-28 DIAGNOSIS — Z125 Encounter for screening for malignant neoplasm of prostate: Secondary | ICD-10-CM | POA: Diagnosis not present

## 2016-03-28 DIAGNOSIS — I10 Essential (primary) hypertension: Secondary | ICD-10-CM | POA: Diagnosis not present

## 2016-03-28 DIAGNOSIS — C61 Malignant neoplasm of prostate: Secondary | ICD-10-CM | POA: Diagnosis not present

## 2016-03-28 DIAGNOSIS — K219 Gastro-esophageal reflux disease without esophagitis: Secondary | ICD-10-CM | POA: Diagnosis not present

## 2016-03-28 DIAGNOSIS — R7301 Impaired fasting glucose: Secondary | ICD-10-CM | POA: Diagnosis not present

## 2016-03-28 DIAGNOSIS — E785 Hyperlipidemia, unspecified: Secondary | ICD-10-CM | POA: Diagnosis not present

## 2016-04-08 DIAGNOSIS — D692 Other nonthrombocytopenic purpura: Secondary | ICD-10-CM | POA: Diagnosis not present

## 2016-04-08 DIAGNOSIS — L570 Actinic keratosis: Secondary | ICD-10-CM | POA: Diagnosis not present

## 2016-04-08 DIAGNOSIS — D485 Neoplasm of uncertain behavior of skin: Secondary | ICD-10-CM | POA: Diagnosis not present

## 2016-04-08 DIAGNOSIS — L821 Other seborrheic keratosis: Secondary | ICD-10-CM | POA: Diagnosis not present

## 2016-04-08 DIAGNOSIS — D1801 Hemangioma of skin and subcutaneous tissue: Secondary | ICD-10-CM | POA: Diagnosis not present

## 2016-04-08 DIAGNOSIS — L723 Sebaceous cyst: Secondary | ICD-10-CM | POA: Diagnosis not present

## 2016-05-19 DIAGNOSIS — Z8546 Personal history of malignant neoplasm of prostate: Secondary | ICD-10-CM | POA: Diagnosis not present

## 2016-05-19 DIAGNOSIS — I10 Essential (primary) hypertension: Secondary | ICD-10-CM | POA: Diagnosis not present

## 2016-05-19 DIAGNOSIS — N183 Chronic kidney disease, stage 3 (moderate): Secondary | ICD-10-CM | POA: Diagnosis not present

## 2016-05-19 DIAGNOSIS — Z6826 Body mass index (BMI) 26.0-26.9, adult: Secondary | ICD-10-CM | POA: Diagnosis not present

## 2016-05-26 ENCOUNTER — Ambulatory Visit (INDEPENDENT_AMBULATORY_CARE_PROVIDER_SITE_OTHER): Payer: Medicare Other | Admitting: Otolaryngology

## 2016-05-26 DIAGNOSIS — H6123 Impacted cerumen, bilateral: Secondary | ICD-10-CM | POA: Diagnosis not present

## 2016-05-26 DIAGNOSIS — H903 Sensorineural hearing loss, bilateral: Secondary | ICD-10-CM

## 2016-06-13 DIAGNOSIS — H31002 Unspecified chorioretinal scars, left eye: Secondary | ICD-10-CM | POA: Diagnosis not present

## 2016-06-13 DIAGNOSIS — H52223 Regular astigmatism, bilateral: Secondary | ICD-10-CM | POA: Diagnosis not present

## 2016-06-13 DIAGNOSIS — H524 Presbyopia: Secondary | ICD-10-CM | POA: Diagnosis not present

## 2016-09-04 ENCOUNTER — Other Ambulatory Visit (HOSPITAL_COMMUNITY): Payer: Self-pay | Admitting: Internal Medicine

## 2016-09-04 DIAGNOSIS — R05 Cough: Secondary | ICD-10-CM | POA: Diagnosis not present

## 2016-09-04 DIAGNOSIS — R059 Cough, unspecified: Secondary | ICD-10-CM

## 2016-09-04 DIAGNOSIS — I10 Essential (primary) hypertension: Secondary | ICD-10-CM | POA: Diagnosis not present

## 2016-09-05 ENCOUNTER — Ambulatory Visit (HOSPITAL_COMMUNITY)
Admission: RE | Admit: 2016-09-05 | Discharge: 2016-09-05 | Disposition: A | Discharge status: Home / Self Care | Payer: Medicare Other | Source: Ambulatory Visit | Attending: Internal Medicine | Admitting: Internal Medicine

## 2016-09-05 DIAGNOSIS — J440 Chronic obstructive pulmonary disease with acute lower respiratory infection: Secondary | ICD-10-CM | POA: Diagnosis not present

## 2016-09-05 DIAGNOSIS — R05 Cough: Secondary | ICD-10-CM | POA: Insufficient documentation

## 2016-09-05 DIAGNOSIS — J449 Chronic obstructive pulmonary disease, unspecified: Secondary | ICD-10-CM

## 2016-09-05 DIAGNOSIS — R918 Other nonspecific abnormal finding of lung field: Secondary | ICD-10-CM

## 2016-09-05 DIAGNOSIS — R0603 Acute respiratory distress: Secondary | ICD-10-CM | POA: Diagnosis not present

## 2016-09-05 DIAGNOSIS — I480 Paroxysmal atrial fibrillation: Secondary | ICD-10-CM | POA: Diagnosis not present

## 2016-09-05 DIAGNOSIS — K2210 Ulcer of esophagus without bleeding: Secondary | ICD-10-CM | POA: Diagnosis not present

## 2016-09-05 DIAGNOSIS — N183 Chronic kidney disease, stage 3 (moderate): Secondary | ICD-10-CM | POA: Diagnosis not present

## 2016-09-05 DIAGNOSIS — I483 Typical atrial flutter: Secondary | ICD-10-CM | POA: Diagnosis not present

## 2016-09-05 DIAGNOSIS — I700 Atherosclerosis of aorta: Secondary | ICD-10-CM

## 2016-09-05 DIAGNOSIS — R059 Cough, unspecified: Secondary | ICD-10-CM

## 2016-09-05 DIAGNOSIS — Z87891 Personal history of nicotine dependence: Secondary | ICD-10-CM | POA: Diagnosis not present

## 2016-09-05 DIAGNOSIS — R0602 Shortness of breath: Secondary | ICD-10-CM | POA: Diagnosis not present

## 2016-09-05 DIAGNOSIS — I4892 Unspecified atrial flutter: Secondary | ICD-10-CM | POA: Diagnosis not present

## 2016-09-05 DIAGNOSIS — I129 Hypertensive chronic kidney disease with stage 1 through stage 4 chronic kidney disease, or unspecified chronic kidney disease: Secondary | ICD-10-CM | POA: Diagnosis not present

## 2016-09-07 ENCOUNTER — Inpatient Hospital Stay (HOSPITAL_COMMUNITY)
Admission: EM | Admit: 2016-09-07 | Discharge: 2016-09-10 | DRG: 191 | Disposition: A | Discharge status: Home / Self Care | Payer: Medicare Other | Attending: Internal Medicine | Admitting: Internal Medicine

## 2016-09-07 ENCOUNTER — Encounter (HOSPITAL_COMMUNITY): Payer: Self-pay | Admitting: Emergency Medicine

## 2016-09-07 ENCOUNTER — Emergency Department (HOSPITAL_COMMUNITY): Payer: Medicare Other

## 2016-09-07 DIAGNOSIS — I10 Essential (primary) hypertension: Secondary | ICD-10-CM | POA: Diagnosis not present

## 2016-09-07 DIAGNOSIS — J441 Chronic obstructive pulmonary disease with (acute) exacerbation: Secondary | ICD-10-CM | POA: Diagnosis present

## 2016-09-07 DIAGNOSIS — N183 Chronic kidney disease, stage 3 (moderate): Secondary | ICD-10-CM | POA: Diagnosis present

## 2016-09-07 DIAGNOSIS — J209 Acute bronchitis, unspecified: Secondary | ICD-10-CM | POA: Diagnosis present

## 2016-09-07 DIAGNOSIS — K5730 Diverticulosis of large intestine without perforation or abscess without bleeding: Secondary | ICD-10-CM | POA: Diagnosis present

## 2016-09-07 DIAGNOSIS — J13 Pneumonia due to Streptococcus pneumoniae: Secondary | ICD-10-CM

## 2016-09-07 DIAGNOSIS — Z7982 Long term (current) use of aspirin: Secondary | ICD-10-CM

## 2016-09-07 DIAGNOSIS — I129 Hypertensive chronic kidney disease with stage 1 through stage 4 chronic kidney disease, or unspecified chronic kidney disease: Secondary | ICD-10-CM | POA: Diagnosis present

## 2016-09-07 DIAGNOSIS — K449 Diaphragmatic hernia without obstruction or gangrene: Secondary | ICD-10-CM | POA: Diagnosis present

## 2016-09-07 DIAGNOSIS — N179 Acute kidney failure, unspecified: Secondary | ICD-10-CM

## 2016-09-07 DIAGNOSIS — I480 Paroxysmal atrial fibrillation: Secondary | ICD-10-CM | POA: Diagnosis present

## 2016-09-07 DIAGNOSIS — I481 Persistent atrial fibrillation: Secondary | ICD-10-CM | POA: Diagnosis not present

## 2016-09-07 DIAGNOSIS — J449 Chronic obstructive pulmonary disease, unspecified: Secondary | ICD-10-CM

## 2016-09-07 DIAGNOSIS — K2210 Ulcer of esophagus without bleeding: Secondary | ICD-10-CM | POA: Diagnosis present

## 2016-09-07 DIAGNOSIS — K208 Other esophagitis without bleeding: Secondary | ICD-10-CM | POA: Diagnosis present

## 2016-09-07 DIAGNOSIS — K219 Gastro-esophageal reflux disease without esophagitis: Secondary | ICD-10-CM | POA: Diagnosis present

## 2016-09-07 DIAGNOSIS — R0603 Acute respiratory distress: Secondary | ICD-10-CM

## 2016-09-07 DIAGNOSIS — J440 Chronic obstructive pulmonary disease with acute lower respiratory infection: Principal | ICD-10-CM | POA: Diagnosis present

## 2016-09-07 DIAGNOSIS — R05 Cough: Secondary | ICD-10-CM | POA: Diagnosis not present

## 2016-09-07 DIAGNOSIS — I4892 Unspecified atrial flutter: Secondary | ICD-10-CM

## 2016-09-07 DIAGNOSIS — I483 Typical atrial flutter: Secondary | ICD-10-CM | POA: Diagnosis present

## 2016-09-07 DIAGNOSIS — J189 Pneumonia, unspecified organism: Secondary | ICD-10-CM | POA: Diagnosis not present

## 2016-09-07 DIAGNOSIS — Z791 Long term (current) use of non-steroidal anti-inflammatories (NSAID): Secondary | ICD-10-CM

## 2016-09-07 DIAGNOSIS — Z87891 Personal history of nicotine dependence: Secondary | ICD-10-CM | POA: Diagnosis not present

## 2016-09-07 DIAGNOSIS — M1990 Unspecified osteoarthritis, unspecified site: Secondary | ICD-10-CM | POA: Diagnosis present

## 2016-09-07 DIAGNOSIS — R0602 Shortness of breath: Secondary | ICD-10-CM | POA: Diagnosis not present

## 2016-09-07 HISTORY — DX: Essential (primary) hypertension: I10

## 2016-09-07 LAB — CBC WITH DIFFERENTIAL/PLATELET
BASOS PCT: 0 %
Basophils Absolute: 0 10*3/uL (ref 0.0–0.1)
EOS ABS: 0 10*3/uL (ref 0.0–0.7)
Eosinophils Relative: 0 %
HCT: 40.5 % (ref 39.0–52.0)
HEMOGLOBIN: 14 g/dL (ref 13.0–17.0)
LYMPHS ABS: 0.7 10*3/uL (ref 0.7–4.0)
Lymphocytes Relative: 6 %
MCH: 29.7 pg (ref 26.0–34.0)
MCHC: 34.6 g/dL (ref 30.0–36.0)
MCV: 85.8 fL (ref 78.0–100.0)
MONO ABS: 1.9 10*3/uL — AB (ref 0.1–1.0)
MONOS PCT: 15 %
NEUTROS PCT: 79 %
Neutro Abs: 9.6 10*3/uL — ABNORMAL HIGH (ref 1.7–7.7)
Platelets: 205 10*3/uL (ref 150–400)
RBC: 4.72 MIL/uL (ref 4.22–5.81)
RDW: 13.5 % (ref 11.5–15.5)
WBC: 12.3 10*3/uL — ABNORMAL HIGH (ref 4.0–10.5)

## 2016-09-07 LAB — TROPONIN I: TROPONIN I: 0.03 ng/mL — AB (ref <0.03)

## 2016-09-07 LAB — BASIC METABOLIC PANEL
Anion gap: 7 (ref 5–15)
BUN: 24 mg/dL — ABNORMAL HIGH (ref 6–20)
CO2: 26 mmol/L (ref 22–32)
Calcium: 8.7 mg/dL — ABNORMAL LOW (ref 8.9–10.3)
Chloride: 105 mmol/L (ref 101–111)
Creatinine, Ser: 1.12 mg/dL (ref 0.61–1.24)
GFR calc Af Amer: >60 mL/min (ref >60)
GFR calc non Af Amer: 60 mL/min — ABNORMAL LOW (ref >60)
Glucose, Bld: 136 mg/dL — ABNORMAL HIGH (ref 65–99)
Potassium: 3.7 mmol/L (ref 3.5–5.1)
Sodium: 138 mmol/L (ref 135–145)

## 2016-09-07 LAB — TSH: TSH: 1.956 u[IU]/mL (ref 0.350–4.500)

## 2016-09-07 LAB — BRAIN NATRIURETIC PEPTIDE: B Natriuretic Peptide: 266 pg/mL — ABNORMAL HIGH (ref 0.0–100.0)

## 2016-09-07 LAB — MRSA PCR SCREENING: MRSA BY PCR: NEGATIVE

## 2016-09-07 LAB — MAGNESIUM: Magnesium: 1.9 mg/dL (ref 1.7–2.4)

## 2016-09-07 MED ORDER — ALBUTEROL SULFATE (2.5 MG/3ML) 0.083% IN NEBU
2.5000 mg | INHALATION_SOLUTION | Freq: Once | RESPIRATORY_TRACT | Status: AC
  Administered 2016-09-07: 2.5 mg via RESPIRATORY_TRACT
  Filled 2016-09-07: qty 3

## 2016-09-07 MED ORDER — CEFTRIAXONE SODIUM 1 G IJ SOLR
1.0000 g | INTRAMUSCULAR | Status: DC
  Administered 2016-09-08 – 2016-09-09 (×2): 1 g via INTRAVENOUS
  Filled 2016-09-07 (×4): qty 10

## 2016-09-07 MED ORDER — PANTOPRAZOLE SODIUM 40 MG PO TBEC
40.0000 mg | DELAYED_RELEASE_TABLET | Freq: Every day | ORAL | Status: DC
  Administered 2016-09-07 – 2016-09-10 (×4): 40 mg via ORAL
  Filled 2016-09-07 (×4): qty 1

## 2016-09-07 MED ORDER — METHYLPREDNISOLONE SODIUM SUCC 125 MG IJ SOLR
125.0000 mg | Freq: Once | INTRAMUSCULAR | Status: AC
  Administered 2016-09-07: 125 mg via INTRAVENOUS
  Filled 2016-09-07: qty 2

## 2016-09-07 MED ORDER — ALBUTEROL SULFATE (2.5 MG/3ML) 0.083% IN NEBU
2.5000 mg | INHALATION_SOLUTION | Freq: Once | RESPIRATORY_TRACT | Status: AC
  Administered 2016-09-07: 2.5 mg via RESPIRATORY_TRACT

## 2016-09-07 MED ORDER — IPRATROPIUM-ALBUTEROL 0.5-2.5 (3) MG/3ML IN SOLN
RESPIRATORY_TRACT | Status: AC
Start: 2016-09-07 — End: 2016-09-07
  Filled 2016-09-07: qty 3

## 2016-09-07 MED ORDER — ALBUTEROL SULFATE (2.5 MG/3ML) 0.083% IN NEBU
INHALATION_SOLUTION | RESPIRATORY_TRACT | Status: AC
Start: 2016-09-07 — End: 2016-09-07
  Filled 2016-09-07: qty 3

## 2016-09-07 MED ORDER — DILTIAZEM HCL 25 MG/5ML IV SOLN
20.0000 mg | Freq: Once | INTRAVENOUS | Status: AC
  Administered 2016-09-07: 20 mg via INTRAVENOUS
  Filled 2016-09-07: qty 5

## 2016-09-07 MED ORDER — ALBUTEROL SULFATE (2.5 MG/3ML) 0.083% IN NEBU: 5.0000 mg | INHALATION_SOLUTION | Freq: Once | RESPIRATORY_TRACT | Status: DC

## 2016-09-07 MED ORDER — LEVALBUTEROL HCL 0.63 MG/3ML IN NEBU
0.6300 mg | INHALATION_SOLUTION | Freq: Four times a day (QID) | RESPIRATORY_TRACT | Status: DC
Start: 2016-09-07 — End: 2016-09-10
  Administered 2016-09-08 – 2016-09-10 (×9): 0.63 mg via RESPIRATORY_TRACT
  Filled 2016-09-07 (×9): qty 3

## 2016-09-07 MED ORDER — IPRATROPIUM-ALBUTEROL 0.5-2.5 (3) MG/3ML IN SOLN
3.0000 mL | Freq: Once | RESPIRATORY_TRACT | Status: AC
  Administered 2016-09-07: 3 mL via RESPIRATORY_TRACT
  Filled 2016-09-07: qty 3

## 2016-09-07 MED ORDER — DEXTROSE 5 % IV SOLN
1.0000 g | Freq: Once | INTRAVENOUS | Status: AC
  Administered 2016-09-07: 1 g via INTRAVENOUS
  Filled 2016-09-07: qty 10

## 2016-09-07 MED ORDER — AZITHROMYCIN 500 MG IV SOLR
500.0000 mg | Freq: Once | INTRAVENOUS | Status: DC
  Administered 2016-09-07: 500 mg via INTRAVENOUS
  Filled 2016-09-07: qty 500

## 2016-09-07 MED ORDER — IPRATROPIUM BROMIDE 0.02 % IN SOLN: 0.5000 mg | Freq: Once | RESPIRATORY_TRACT | Status: DC

## 2016-09-07 MED ORDER — IPRATROPIUM BROMIDE 0.02 % IN SOLN: 0.5000 mg | Freq: Four times a day (QID) | RESPIRATORY_TRACT | Status: DC

## 2016-09-07 MED ORDER — METOPROLOL SUCCINATE ER 25 MG PO TB24
25.0000 mg | ORAL_TABLET | Freq: Every day | ORAL | Status: DC
  Administered 2016-09-07 – 2016-09-10 (×4): 25 mg via ORAL
  Filled 2016-09-07 (×4): qty 1

## 2016-09-07 MED ORDER — RIVAROXABAN 20 MG PO TABS
20.0000 mg | ORAL_TABLET | Freq: Every day | ORAL | Status: DC
  Administered 2016-09-07 – 2016-09-09 (×3): 20 mg via ORAL
  Filled 2016-09-07 (×4): qty 1

## 2016-09-07 MED ORDER — LEVALBUTEROL HCL 0.63 MG/3ML IN NEBU
INHALATION_SOLUTION | RESPIRATORY_TRACT | Status: AC
  Filled 2016-09-07: qty 3

## 2016-09-07 MED ORDER — IPRATROPIUM BROMIDE 0.02 % IN SOLN
0.5000 mg | Freq: Four times a day (QID) | RESPIRATORY_TRACT | Status: DC
Start: 2016-09-07 — End: 2016-09-10
  Administered 2016-09-08 – 2016-09-10 (×9): 0.5 mg via RESPIRATORY_TRACT
  Filled 2016-09-07 (×9): qty 2.5

## 2016-09-07 MED ORDER — LEVALBUTEROL HCL 0.63 MG/3ML IN NEBU: 0.6300 mg | INHALATION_SOLUTION | Freq: Four times a day (QID) | RESPIRATORY_TRACT | Status: DC

## 2016-09-07 MED ORDER — IPRATROPIUM-ALBUTEROL 0.5-2.5 (3) MG/3ML IN SOLN
3.0000 mL | Freq: Once | RESPIRATORY_TRACT | Status: AC
  Administered 2016-09-07: 3 mL via RESPIRATORY_TRACT

## 2016-09-07 MED ORDER — IPRATROPIUM-ALBUTEROL 0.5-2.5 (3) MG/3ML IN SOLN
3.0000 mL | Freq: Four times a day (QID) | RESPIRATORY_TRACT | Status: DC
Start: 2016-09-07 — End: 2016-09-07

## 2016-09-07 MED ORDER — LEVALBUTEROL HCL 0.63 MG/3ML IN NEBU
0.6300 mg | INHALATION_SOLUTION | Freq: Four times a day (QID) | RESPIRATORY_TRACT | Status: DC
  Administered 2016-09-07 (×2): 0.63 mg via RESPIRATORY_TRACT
  Filled 2016-09-07: qty 3

## 2016-09-07 MED ORDER — ALBUTEROL SULFATE (2.5 MG/3ML) 0.083% IN NEBU
5.0000 mg | INHALATION_SOLUTION | Freq: Once | RESPIRATORY_TRACT | Status: AC
  Administered 2016-09-07: 5 mg via RESPIRATORY_TRACT
  Filled 2016-09-07: qty 6

## 2016-09-07 MED ORDER — IPRATROPIUM BROMIDE 0.02 % IN SOLN
RESPIRATORY_TRACT | Status: AC
  Filled 2016-09-07: qty 2.5

## 2016-09-07 MED ORDER — METHYLPREDNISOLONE SODIUM SUCC 125 MG IJ SOLR
60.0000 mg | Freq: Three times a day (TID) | INTRAMUSCULAR | Status: DC
  Administered 2016-09-07 – 2016-09-08 (×3): 60 mg via INTRAVENOUS
  Filled 2016-09-07 (×3): qty 2

## 2016-09-07 MED ORDER — DILTIAZEM HCL 30 MG PO TABS
30.0000 mg | ORAL_TABLET | Freq: Four times a day (QID) | ORAL | Status: DC
  Administered 2016-09-07 – 2016-09-09 (×8): 30 mg via ORAL
  Filled 2016-09-07 (×8): qty 1

## 2016-09-07 MED ORDER — LEVALBUTEROL HCL 0.63 MG/3ML IN NEBU: 0.6300 mg | INHALATION_SOLUTION | RESPIRATORY_TRACT | Status: DC | PRN

## 2016-09-07 MED ORDER — DEXTROSE 5 % IV SOLN
500.0000 mg | INTRAVENOUS | Status: DC
  Administered 2016-09-08 – 2016-09-09 (×2): 500 mg via INTRAVENOUS
  Filled 2016-09-07 (×3): qty 500

## 2016-09-07 MED ORDER — IPRATROPIUM BROMIDE 0.02 % IN SOLN
0.5000 mg | Freq: Four times a day (QID) | RESPIRATORY_TRACT | Status: DC
  Administered 2016-09-07 (×2): 0.5 mg via RESPIRATORY_TRACT
  Filled 2016-09-07: qty 2.5

## 2016-09-07 NOTE — ED Notes (Signed)
Patient transported to X-ray 

## 2016-09-07 NOTE — ED Notes (Signed)
Pt placed on 2L 02 [place].   

## 2016-09-07 NOTE — ED Notes (Signed)
Patient returned from X-ray 

## 2016-09-07 NOTE — ED Triage Notes (Signed)
Pt states that Cough started on June 12th with a cough, but cough has gotten worse. Pt was seen here for an x-ray as an outpatient on Friday. Pt has tried nightquil but experienced no relieve.   BP med - Zetrireatic PCP said was causing cough.
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
## 2016-09-07 NOTE — H&P (Signed)
[facility] History and Physical  Glenn Bean [id number] DOB: 1933/01/17 DOA: 09/07/2016  Referring physician: Sabra Heck, ED PCP: Asencion Noble, MD  Specialists: none yet  Chief Complaint: SOB  HPI:  82 year old male Known history hypertension Prior heavy tobacco use 1 pack per day and quit about 20-30 years ago Prostate hypertrophy status post surgery Prior subdural hemorrhage status post evacuation 2012 Otherwise bland medical history presented to emergency room at [facility] 09/07/2016 Test me that he is had cough cold and bronchitis type symptoms since June but this has been getting worse and cause of this on 09/05/2016 went to primary care physician office got an x-ray showing bronchitis Was told at that time that it may be one of his medications causing this.[email or web address] Probably one of his hypertensive meds Does not have any history of arrhythmia nor cardiac disease  Return to the emergency room after 2 intractable cough and discomfort No fever but noted low-grade fevers at home 100 0.6 No diarrhea no dysuria no nausea no vomiting no ill contacts No overt rash or other symptomatology He has no paroxysmal nocturnal dyspnea and does not feel any positional component to worsening or making his shortness of breath better  At baseline high functioning able to do most of his ADLs, still driving and also still working in the yard  In the emergency room found to be tachycardic and EKG performed shows a flutter 4:1-he is without chest pain White count 12.3 platelet 205, hemoglobin 14 BUN/creatinine 20/1.1 calcium 8 7-Baseline BUN/creatinine =/20/1.1  No known drug allergies Mother passed away with heart disease Father passed with throat cancer   Labs on Admission:  Basic Metabolic Panel:  Recent Labs Lab 09/07/16 0749  NA 138  K 3.7  CL 105  CO2 26  GLUCOSE 136*  BUN 24*  CREATININE 1.12  CALCIUM 8.7*   Liver Function Tests: No results for input(s): AST,  ALT, ALKPHOS, BILITOT, PROT, ALBUMIN in the last 168 hours. No results for input(s): LIPASE, AMYLASE in the last 168 hours. No results for input(s): AMMONIA in the last 168 hours. CBC:  Recent Labs Lab 09/07/16 0749  WBC 12.3*  NEUTROABS 9.6*  HGB 14.0  HCT 40.5  MCV 85.8  PLT 205   Cardiac Enzymes:  Recent Labs Lab 09/07/16 0749  TROPONINI <0.03    BNP (last 3 results)  Recent Labs  09/07/16 0749  BNP 266.0*    ProBNP (last 3 results) No results for input(s): PROBNP in the last 8760 hours.  CBG: No results for input(s): GLUCAP in the last 168 hours.  Alert pleasant oriented choking at the bedside EOMI NCAT no acute first for S1-S2 slightly tachycardic but rate controlled Chest has mild wheezes No JVD No bruit Abdomen soft nontender no organomegaly slightly obese no rebound no guarding No lower extremity edema Range of motion is intact Smile is symmetric Powers 5/5 Reflexes not tested  Radiological Exams on Admission: Dg Chest 2 View  Result Date: 09/07/2016 CLINICAL DATA:  82 year old male with history of cough since May 19, 2016, worsening today. Shortness of breath. EXAM: CHEST  2 VIEW COMPARISON:  Chest x-ray 09/05/2016. FINDINGS: Diffuse peribronchial cuffing. Lung volumes are normal. No consolidative airspace disease. No pleural effusions. No pneumothorax. No pulmonary nodule or mass noted. Pulmonary vasculature and the cardiomediastinal silhouette are within normal limits. Aortic atherosclerosis. IMPRESSION: 1. Diffuse peribronchial cuffing may suggest an acute bronchitis. 2. Aortic atherosclerosis. Electronically Signed   By: Vinnie Langton M.D.   On: 09/07/2016 08:50  Dg Chest 2 View  Result Date: 09/05/2016 CLINICAL DATA:  Six months of cough without clinical improvement. History of hypertension gastroesophageal reflux. Former heavy smoker. EXAM: CHEST  2 VIEW COMPARISON:  PA and lateral chest x-ray of November 28, 2011 FINDINGS: The lungs are  mildly hyperinflated. The interstitial markings are chronically increased but are overall slightly more conspicuous than on the previous study. There is focal increased interstitial density in the left lower lobe as compared to the previous study. There is no alveolar infiltrate or pleural effusion. The heart and pulmonary vascularity are normal. There is calcification within the wall of the aortic arch. There is calcification of portions of the anterior longitudinal ligament of the thoracic spine. IMPRESSION: COPD. Increased lung markings over baseline but greatest in the left lower lobe. This may reflect interstitial pneumonia. Followup PA and lateral chest X-ray is recommended in 3-4 weeks following trial of antibiotic therapy to ensure resolution and exclude underlying malignancy. No CHF.  Aortic atherosclerosis. Electronically Signed   By: David  Martinique M.D.   On: 09/05/2016 15:47    EKG: Independently reviewed. Atrial flutter QRS axis is 70 no ST-T wave inversions however rate related changes  Assessment/Plan Principal Problem:   CAP (community acquired pneumonia) Agree with coverage with azithromycin and ceftriaxone for right now CBC + differential count a.m.  Given long-standing smoking history consider an out patient CT scan Recommending at this stage also treatment for concomittant probable COPD so start low-dose IV Solu-Medrol Nebulized with duo nebs May benefit from pulmonology input in the next 24-48 hours if no better     Atrial flutter ([place]) [id number]=2 New diagnosis so would get echocardiogram, TSH, and cycle troponins although not having chest pain and EKG is benign Likely driven by pneumonia process Start oral Cardizem 30 mg every 6 hourly and monitor blood pressure and heart rate for effect We'll continue metoprolol 25 mg XL from home Will probably need oral anticoagulation if there is persistent a flutter would continue aspirin on top of the same    AKI (acute kidney  injury) ([place]) IV saline 75 cc per hour ongoing Repeat labs a.m. Probably from poor appetite  ?heart failure Diagnosis is unclear at present time proBNP is slightly elevated to 66 Obtain echocardiogram  EROSIVE ESOPHAGITIS Prior history of Substitute pantoprazole 40 daily for omeprazole Monitor  Prior history of SDH Stable at present time  Prior history of prostate cancer versus prostate hypertrophy status post TURP Careful with anticholinergic agents as may cause retention Monitor    Diaphragmatic herna   Diverticulosis of colon    Discussed with daughter at the bedside who is a nurse in addition to wife  Inpatient wishes to be DO NOT RESUSCITATE  Time spent: [place], [facility] Pager [phone]  If 7PM-7AM, please contact night-coverage [email or web address] Password [email or web address] 09/07/2016, 9:37 AM

## 2016-09-07 NOTE — Progress Notes (Signed)
Pharmacy Antibiotic Note  Glenn Bean is a 82 y.o. male admitted on 09/07/2016 with pneumonia.  Pharmacy has been consulted for renal dose adjustment  Azithromycin and Ceftriaxone started in ED  Plan: Continue Ceftriaxone 1 GM IV every 24 hours Continue Azithromycin 500 mg IV every 24 hours F/U oral therapy   Height: 6' (182.9 cm) Weight: 171 lb 8.3 oz (77.8 kg) IBW/kg (Calculated) : 77.6  Temp (24hrs), Avg:98 F (36.7 C), Min:97.9 F (36.6 C), Max:98.1 F (36.7 C)   Recent Labs Lab 09/07/16 0749  WBC 12.3*  CREATININE 1.12    Estimated Creatinine Clearance: 56.8 mL/min (by C-G formula based on SCr of 1.12 mg/dL).    No Known Allergies  Antimicrobials this admission: Azithromycin 10/1 >>  Ceftriaxone 10/1 >>   Dose adjustments this admission:   *  Thank you for allowing pharmacy to be a part of this patient's care.  Chriss Czar 09/07/2016 11:39 AM

## 2016-09-07 NOTE — ED Provider Notes (Signed)
The patient is an 82 year old male who is currently diagnosed with hypertension, he does not take any other daily medications other than a stool softener. He presents to the hospital with 4 days of worsening shortness of breath. He has had increasing coughing, fevers as high as 100.6 at home, he was seen by his family doctor a couple of days ago and after requesting to have a chest x-ray done it was read as having some increased interstitial markings with increased markings at the left base. The patient reports that he has had increased dyspnea, increased amounts of coughing, he is not comfortable and has difficulty walking because of the shortness of breath. On exam the patient has subtle rales at the bases bilaterally, he has decreased air movement and has wheezing on forced expiration. There is an increased work of breathing, he has some supraclavicular retractions when he tries to sit up in the bed, he has no peripheral edema, he has no JVD. I suspected the patient is having worsening COPD with possibly superimposed pneumonia. I would also consider that he has interstitial lung disease. Currently his oxygenation is 93% on room air, he does feel better wearing oxygen. His cardiac exam is significant for what appears to be atrial flutter with variable block. He has never been diagnosed with any heart conditions. I suspect this is related to hypoxia. He will receive a single dose of Cardizem, albuterol treatments, steroids, antibiotics. Consider admission to the hospital for this patient with multisystem organ involvement .    D/w Dr. Verlon Au who will admit  CRITICAL CARE Performed by: Johnna Acosta Total critical care time: 35 minutes Critical care time was exclusive of separately billable procedures and treating other patients. Critical care was necessary to treat or prevent imminent or life-threatening deterioration. Critical care was time spent personally by me on the following activities: development  of treatment plan with patient and/or surrogate as well as nursing, discussions with consultants, evaluation of patient's response to treatment, examination of patient, obtaining history from patient or surrogate, ordering and performing treatments and interventions, ordering and review of laboratory studies, ordering and review of radiographic studies, pulse oximetry and re-evaluation of patient's condition.  Multiple nebs Rate control with cardizem Solumedrol  Medical screening examination/treatment/procedure(s) were conducted as a shared visit with non-physician practitioner(s) and myself.  I personally evaluated the patient during the encounter.  Clinical Impression:   Final diagnoses:  Respiratory distress  Chronic obstructive pulmonary disease, unspecified COPD type ([place])  Atrial flutter, unspecified type ([facility])         Noemi Chapel, MD 09/07/16 1330

## 2016-09-07 NOTE — ED Notes (Signed)
Pt ambulated on room air.  PT was 92% on RA prior to walking, upon ambulating pt 02 sats dipped to 87%. Dr. Sabra Heck informed.

## 2016-09-08 ENCOUNTER — Inpatient Hospital Stay (HOSPITAL_COMMUNITY): Payer: Medicare Other

## 2016-09-08 ENCOUNTER — Encounter (HOSPITAL_COMMUNITY): Payer: Self-pay | Admitting: Cardiology

## 2016-09-08 DIAGNOSIS — I481 Persistent atrial fibrillation: Secondary | ICD-10-CM

## 2016-09-08 DIAGNOSIS — J189 Pneumonia, unspecified organism: Secondary | ICD-10-CM

## 2016-09-08 DIAGNOSIS — I4892 Unspecified atrial flutter: Secondary | ICD-10-CM

## 2016-09-08 DIAGNOSIS — I10 Essential (primary) hypertension: Secondary | ICD-10-CM

## 2016-09-08 LAB — CBC
HCT: 37.6 % — ABNORMAL LOW (ref 39.0–52.0)
Hemoglobin: 12.9 g/dL — ABNORMAL LOW (ref 13.0–17.0)
MCH: 29.3 pg (ref 26.0–34.0)
MCHC: 34.3 g/dL (ref 30.0–36.0)
MCV: 85.3 fL (ref 78.0–100.0)
PLATELETS: 233 10*3/uL (ref 150–400)
RBC: 4.41 MIL/uL (ref 4.22–5.81)
RDW: 13.6 % (ref 11.5–15.5)
WBC: 12.7 10*3/uL — AB (ref 4.0–10.5)

## 2016-09-08 LAB — TROPONIN I: Troponin I: <0.03 ng/mL (ref <0.03)

## 2016-09-08 LAB — ECHOCARDIOGRAM COMPLETE
Height: 72 in
WEIGHTICAEL: 2828.94 [oz_av]

## 2016-09-08 LAB — STREP PNEUMONIAE URINARY ANTIGEN: STREP PNEUMO URINARY ANTIGEN: NEGATIVE

## 2016-09-08 LAB — HIV ANTIBODY (ROUTINE TESTING W REFLEX): HIV Screen 4th Generation wRfx: NONREACTIVE

## 2016-09-08 MED ORDER — METHYLPREDNISOLONE SODIUM SUCC 125 MG IJ SOLR
60.0000 mg | Freq: Two times a day (BID) | INTRAMUSCULAR | Status: DC
  Administered 2016-09-08 – 2016-09-09 (×3): 60 mg via INTRAVENOUS
  Filled 2016-09-08 (×4): qty 2

## 2016-09-08 MED ORDER — OFF THE BEAT BOOK
Freq: Once | Status: AC
  Administered 2016-09-09: 1
  Filled 2016-09-08: qty 1

## 2016-09-08 MED ORDER — DEXTROSE 50 % IV SOLN
INTRAVENOUS | Status: AC
  Filled 2016-09-08: qty 50

## 2016-09-08 MED ORDER — ORAL CARE MOUTH RINSE
15.0000 mL | Freq: Two times a day (BID) | OROMUCOSAL | Status: DC
  Administered 2016-09-08 – 2016-09-09 (×2): 15 mL via OROMUCOSAL

## 2016-09-08 NOTE — Care Management Important Message (Signed)
Important Message  Patient Details  Name: ERASTUS NICASIO MRN: [id number] Date of Birth: [birth year]   Medicare Important Message Given:  Yes    Sherald Barge, RN 09/08/2016, 2:17 PM

## 2016-09-08 NOTE — Discharge Instructions (Signed)
Rivaroxaban oral tablets What is this medicine? RIVAROXABAN (ri va ROX a ban) is an anticoagulant (blood thinner). It is used to treat blood clots in the lungs or in the veins. It is also used after knee or hip surgeries to prevent blood clots. It is also used to lower the chance of stroke in people with a medical condition called atrial fibrillation. This medicine may be used for other purposes; ask your health care provider or pharmacist if you have questions. What should I tell my health care provider before I take this medicine? They need to know if you have any of these conditions: -bleeding disorders -bleeding in the brain -blood in your stools (black or tarry stools) or if you have blood in your vomit -history of stomach bleeding -kidney disease -liver disease -low blood counts, like low white cell, platelet, or red cell counts -recent or planned spinal or epidural procedure -take medicines that treat or prevent blood clots -an unusual or allergic reaction to rivaroxaban, other medicines, foods, dyes, or preservatives -pregnant or trying to get pregnant -breast-feeding How should I use this medicine? Take this medicine by mouth with a glass of water. Follow the directions on the prescription label. Take your medicine at regular intervals. Do not take it more often than directed. Do not stop taking except on your doctor's advice. Stopping this medicine may increase your risk of a blood clot. Be sure to refill your prescription before you run out of medicine. If you are taking this medicine after hip or knee replacement surgery, take it with or without food. If you are taking this medicine for atrial fibrillation, take it with your evening meal. If you are taking this medicine to treat blood clots, take it with food at the same time each day. If you are unable to swallow your tablet, you may crush the tablet and mix it in applesauce. Then, immediately eat the applesauce. You should eat more  food right after you eat the applesauce containing the crushed tablet. Talk to your pediatrician regarding the use of this medicine in children. Special care may be needed. Overdosage: If you think you have taken too much of this medicine contact a poison control center or emergency room at once. NOTE: This medicine is only for you. Do not share this medicine with others. What if I miss a dose? If you take your medicine once a day and miss a dose, take the missed dose as soon as you remember. If you take your medicine twice a day and miss a dose, take the missed dose immediately. In this instance, 2 tablets may be taken at the same time. The next day you should take 1 tablet twice a day as directed. What may interact with this medicine? -aspirin and aspirin-like medicines -certain antibiotics like erythromycin, azithromycin, and clarithromycin -certain medicines for fungal infections like ketoconazole and itraconazole -certain medicines for irregular heart beat like amiodarone, quinidine, dronedarone -certain medicines for seizures like carbamazepine, phenytoin -certain medicines that treat or prevent blood clots like warfarin, enoxaparin, and dalteparin -conivaptan -diltiazem -felodipine -indinavir -lopinavir; ritonavir -NSAIDS, medicines for pain and inflammation, like ibuprofen or naproxen -ranolazine -rifampin -ritonavir -St. John's wort -verapamil This list may not describe all possible interactions. Give your health care provider a list of all the medicines, herbs, non-prescription drugs, or dietary supplements you use. Also tell them if you smoke, drink alcohol, or use illegal drugs. Some items may interact with your medicine. What should I watch for while using this   medicine? Visit your doctor or health care professional for regular checks on your progress. Your condition will be monitored carefully while you are receiving this medicine. Notify your doctor or health care  professional and seek emergency treatment if you develop breathing problems; changes in vision; chest pain; severe, sudden headache; pain, swelling, warmth in the leg; trouble speaking; sudden numbness or weakness of the face, arm, or leg. These can be signs that your condition has gotten worse. If you are going to have surgery, tell your doctor or health care professional that you are taking this medicine. Tell your health care professional that you use this medicine before you have a spinal or epidural procedure. Sometimes people who take this medicine have bleeding problems around the spine when they have a spinal or epidural procedure. This bleeding is very rare. If you have a spinal or epidural procedure while on this medicine, call your health care professional immediately if you have back pain, numbness or tingling (especially in your legs and feet), muscle weakness, paralysis, or loss of bladder or bowel control. Avoid sports and activities that might cause injury while you are using this medicine. Severe falls or injuries can cause unseen bleeding. Be careful when using sharp tools or knives. Consider using an electric razor. Take special care brushing or flossing your teeth. Report any injuries, bruising, or red spots on the skin to your doctor or health care professional. What side effects may I notice from receiving this medicine? Side effects that you should report to your doctor or health care professional as soon as possible: -allergic reactions like skin rash, itching or hives, swelling of the face, lips, or tongue -back pain -redness, blistering, peeling or loosening of the skin, including inside the mouth -signs and symptoms of bleeding such as bloody or black, tarry stools; red or dark-brown urine; spitting up blood or brown material that looks like coffee grounds; red spots on the skin; unusual bruising or bleeding from the eye, gums, or nose Side effects that usually do not require  medical attention (Report these to your doctor or health care professional if they continue or are bothersome.): -dizziness -muscle pain This list may not describe all possible side effects. Call your doctor for medical advice about side effects. You may report side effects to FDA at 1-800-FDA-1088. Where should I keep my medicine? Keep out of the reach of children. Store at room temperature between 15 and 30 degrees C (59 and 86 degrees F). Throw away any unused medicine after the expiration date. NOTE: This sheet is a summary. It may not cover all possible information. If you have questions about this medicine, talk to your doctor, pharmacist, or health care provider.    2016, Elsevier/Gold Standard. (2014-11-22 12:45:34)  

## 2016-09-08 NOTE — Progress Notes (Signed)
Subjective: He states he is breathing better today. No fever.  Objective: Vital signs in last 24 hours: Vitals:   09/08/16 0200 09/08/16 0300 09/08/16 0400 09/08/16 0500  BP: 119/73 119/74 130/64   Pulse:   79   Resp: (!) 21 (!) 22 (!) 21   Temp:   97.7 F (36.5 C)   TempSrc:   Oral   SpO2: 90% 99% 96%   Weight:    176 lb 12.9 oz (80.2 kg)  Height:       Weight change:   Intake/Output Summary (Last 24 hours) at 09/08/16 0736 Last data filed at 09/07/16 2130  Gross per 24 hour  Intake              570 ml  Output               50 ml  Net              520 ml    Physical Exam: Alert. Oxygen saturation 95% on 2 L. Lungs clear. Heart regular with no murmurs. Telemetry reveals atrial flutter with rates varying in the 70s to 80s. Abdomen soft and nontender. Extremities reveal no edema. Neuro at baseline.  Lab Results:    Results for orders placed or performed during the hospital encounter of 09/07/16 (from the past 24 hour(s))  Brain natriuretic peptide     Status: Abnormal   Collection Time: 09/07/16  7:49 AM  Result Value Ref Range   B Natriuretic Peptide 266.0 (H) 0.0 - 100.0 pg/mL  Basic metabolic panel     Status: Abnormal   Collection Time: 09/07/16  7:49 AM  Result Value Ref Range   Sodium 138 135 - 145 mmol/L   Potassium 3.7 3.5 - 5.1 mmol/L   Chloride 105 101 - 111 mmol/L   CO2 26 22 - 32 mmol/L   Glucose, Bld 136 (H) 65 - 99 mg/dL   BUN 24 (H) 6 - 20 mg/dL   Creatinine, Ser 1.12 0.61 - 1.24 mg/dL   Calcium 8.7 (L) 8.9 - 10.3 mg/dL   GFR calc non Af Amer 60 (L) >60 mL/min   GFR calc Af Amer >60 >60 mL/min   Anion gap 7 5 - 15  CBC with Differential     Status: Abnormal   Collection Time: 09/07/16  7:49 AM  Result Value Ref Range   WBC 12.3 (H) 4.0 - 10.5 K/uL   RBC 4.72 4.22 - 5.81 MIL/uL   Hemoglobin 14.0 13.0 - 17.0 g/dL   HCT 40.5 39.0 - 52.0 %   MCV 85.8 78.0 - 100.0 fL   MCH 29.7 26.0 - 34.0 pg   MCHC 34.6 30.0 - 36.0 g/dL   RDW 13.5 11.5 - 15.5 %    Platelets 205 150 - 400 K/uL   Neutrophils Relative % 79 %   Neutro Abs 9.6 (H) 1.7 - 7.7 K/uL   Lymphocytes Relative 6 %   Lymphs Abs 0.7 0.7 - 4.0 K/uL   Monocytes Relative 15 %   Monocytes Absolute 1.9 (H) 0.1 - 1.0 K/uL   Eosinophils Relative 0 %   Eosinophils Absolute 0.0 0.0 - 0.7 K/uL   Basophils Relative 0 %   Basophils Absolute 0.0 0.0 - 0.1 K/uL  Troponin I     Status: None   Collection Time: 09/07/16  7:49 AM  Result Value Ref Range   Troponin I <0.03 <0.03 ng/mL  MRSA PCR Screening     Status: None  Collection Time: 09/07/16 11:20 AM  Result Value Ref Range   MRSA by PCR NEGATIVE NEGATIVE  Magnesium     Status: None   Collection Time: 09/07/16 11:27 AM  Result Value Ref Range   Magnesium 1.9 1.7 - 2.4 mg/dL  TSH     Status: None   Collection Time: 09/07/16 11:27 AM  Result Value Ref Range   TSH 1.956 0.350 - 4.500 uIU/mL  Troponin I     Status: None   Collection Time: 09/07/16 11:27 AM  Result Value Ref Range   Troponin I <0.03 <0.03 ng/mL  Culture, blood (routine x 2) Call MD if unable to obtain prior to antibiotics being given     Status: None (Preliminary result)   Collection Time: 09/07/16 11:27 AM  Result Value Ref Range   Specimen Description BLOOD RIGHT ANTECUBITAL    Special Requests BOTTLES DRAWN AEROBIC AND ANAEROBIC 6CC    Culture PENDING    Report Status PENDING   Culture, blood (routine x 2) Call MD if unable to obtain prior to antibiotics being given     Status: None (Preliminary result)   Collection Time: 09/07/16 11:40 AM  Result Value Ref Range   Specimen Description BLOOD BLOOD RIGHT HAND    Special Requests BOTTLES DRAWN AEROBIC AND ANAEROBIC 6CC    Culture PENDING    Report Status PENDING   Troponin I     Status: Abnormal   Collection Time: 09/07/16  5:01 PM  Result Value Ref Range   Troponin I 0.03 (HH) <0.03 ng/mL  Troponin I     Status: None   Collection Time: 09/07/16 11:51 PM  Result Value Ref Range   Troponin I <0.03 <0.03  ng/mL  CBC     Status: Abnormal   Collection Time: 09/08/16  4:44 AM  Result Value Ref Range   WBC 12.7 (H) 4.0 - 10.5 K/uL   RBC 4.41 4.22 - 5.81 MIL/uL   Hemoglobin 12.9 (L) 13.0 - 17.0 g/dL   HCT 37.6 (L) 39.0 - 52.0 %   MCV 85.3 78.0 - 100.0 fL   MCH 29.3 26.0 - 34.0 pg   MCHC 34.3 30.0 - 36.0 g/dL   RDW 13.6 11.5 - 15.5 %   Platelets 233 150 - 400 K/uL     ABGS No results for input(s): PHART, PO2ART, TCO2, HCO3 in the last 72 hours.  Invalid input(s): PCO2 CULTURES Recent Results (from the past 240 hour(s))  MRSA PCR Screening     Status: None   Collection Time: 09/07/16 11:20 AM  Result Value Ref Range Status   MRSA by PCR NEGATIVE NEGATIVE Final    Comment:        The GeneXpert MRSA Assay (FDA approved for NASAL specimens only), is one component of a comprehensive MRSA colonization surveillance program. It is not intended to diagnose MRSA infection nor to guide or monitor treatment for MRSA infections.   Culture, blood (routine x 2) Call MD if unable to obtain prior to antibiotics being given     Status: None (Preliminary result)   Collection Time: 09/07/16 11:27 AM  Result Value Ref Range Status   Specimen Description BLOOD RIGHT ANTECUBITAL  Final   Special Requests BOTTLES DRAWN AEROBIC AND ANAEROBIC 6CC  Final   Culture PENDING  Incomplete   Report Status PENDING  Incomplete  Culture, blood (routine x 2) Call MD if unable to obtain prior to antibiotics being given     Status: None (Preliminary result)  Collection Time: 09/07/16 11:40 AM  Result Value Ref Range Status   Specimen Description BLOOD BLOOD RIGHT HAND  Final   Special Requests BOTTLES DRAWN AEROBIC AND ANAEROBIC [facility]  Final   Culture PENDING  Incomplete   Report Status PENDING  Incomplete   Studies/Results: Dg Chest 2 View  Result Date: 09/07/2016 CLINICAL DATA:  82 year old male with history of cough since May 19, 2016, worsening today. Shortness of breath. EXAM: CHEST  2 VIEW COMPARISON:   Chest x-ray 09/05/2016. FINDINGS: Diffuse peribronchial cuffing. Lung volumes are normal. No consolidative airspace disease. No pleural effusions. No pneumothorax. No pulmonary nodule or mass noted. Pulmonary vasculature and the cardiomediastinal silhouette are within normal limits. Aortic atherosclerosis. IMPRESSION: 1. Diffuse peribronchial cuffing may suggest an acute bronchitis. 2. Aortic atherosclerosis. Electronically Signed   By: Vinnie Langton M.D.   On: 09/07/2016 08:50   Micro Results: Recent Results (from the past 240 hour(s))  MRSA PCR Screening     Status: None   Collection Time: 09/07/16 11:20 AM  Result Value Ref Range Status   MRSA by PCR NEGATIVE NEGATIVE Final    Comment:        The GeneXpert MRSA Assay (FDA approved for NASAL specimens only), is one component of a comprehensive MRSA colonization surveillance program. It is not intended to diagnose MRSA infection nor to guide or monitor treatment for MRSA infections.   Culture, blood (routine x 2) Call MD if unable to obtain prior to antibiotics being given     Status: None (Preliminary result)   Collection Time: 09/07/16 11:27 AM  Result Value Ref Range Status   Specimen Description BLOOD RIGHT ANTECUBITAL  Final   Special Requests BOTTLES DRAWN AEROBIC AND ANAEROBIC 6CC  Final   Culture PENDING  Incomplete   Report Status PENDING  Incomplete  Culture, blood (routine x 2) Call MD if unable to obtain prior to antibiotics being given     Status: None (Preliminary result)   Collection Time: 09/07/16 11:40 AM  Result Value Ref Range Status   Specimen Description BLOOD BLOOD RIGHT HAND  Final   Special Requests BOTTLES DRAWN AEROBIC AND ANAEROBIC [facility]  Final   Culture PENDING  Incomplete   Report Status PENDING  Incomplete   Studies/Results: Dg Chest 2 View  Result Date: 09/07/2016 CLINICAL DATA:  82 year old male with history of cough since May 19, 2016, worsening today. Shortness of breath. EXAM: CHEST  2 VIEW  COMPARISON:  Chest x-ray 09/05/2016. FINDINGS: Diffuse peribronchial cuffing. Lung volumes are normal. No consolidative airspace disease. No pleural effusions. No pneumothorax. No pulmonary nodule or mass noted. Pulmonary vasculature and the cardiomediastinal silhouette are within normal limits. Aortic atherosclerosis. IMPRESSION: 1. Diffuse peribronchial cuffing may suggest an acute bronchitis. 2. Aortic atherosclerosis. Electronically Signed   By: Vinnie Langton M.D.   On: 09/07/2016 08:50   Medications:  I have reviewed the patient's current medications Scheduled Meds: . azithromycin  500 mg Intravenous Q24H  . cefTRIAXone (ROCEPHIN)  IV  1 g Intravenous Q24H  . diltiazem  30 mg Oral Q6H  . ipratropium  0.5 mg Nebulization QID  . levalbuterol  0.63 mg Nebulization QID  . mouth rinse  15 mL Mouth Rinse BID  . methylPREDNISolone sodium succinate  60 mg Intravenous Q8H  . metoprolol succinate  25 mg Oral Daily  . off the beat book   Does not apply Once  . pantoprazole  40 mg Oral Daily  . rivaroxaban  20 mg Oral Q supper  Continuous Infusions:  PRN Meds:.levalbuterol   Assessment/Plan: #1. Community acquired pneumonia. Continue Rocephin and Zithromax. White count 12.7. Afebrile. Blood cultures pending. Continue Xopenex nebulizers. Reduce Solu-Medrol. #2. Atrial flutter. Troponins are negative. Echo pending. Consult cardiology. Continue Xarelto. Continue metoprolol ER and diltiazem. Rate well controlled. Principal Problem:   CAP (community acquired pneumonia) Active Problems:   EROSIVE ESOPHAGITIS   Diaphragmatic hernia   Diverticulosis of colon   Atrial flutter ([place])   AKI (acute kidney injury) ([place])   PNA (pneumonia)     LOS: 1 day   Ramil Edgington 09/08/2016, 7:36 AM
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
## 2016-09-08 NOTE — Progress Notes (Signed)
*  PRELIMINARY RESULTS* Echocardiogram 2D Echocardiogram has been performed.  Glenn Bean 09/08/2016, 3:20 PM

## 2016-09-08 NOTE — Care Management Note (Signed)
Case Management Note  Patient Details  Name: Glenn Bean MRN: [id number] Date of Birth: July 09, 1933  Subjective/Objective:                  Pt admitted with CAP. Pt is from home, lives with his wife. Pt sleeping and gave permission to obtain pt info from wife at beside. Pt is ind with ADL's. He have PCP , drives himself to appointments. He insurance with drug coverage and no difficulty affording medications. He has no HH or DME PTA. Pt plans to return home with self care.   Action/Plan: No CM needs anticipated.   Expected Discharge Date:      09/12/2016            Expected Discharge Plan:  Home/Self Care  In-House Referral:  NA  Discharge planning Services  CM Consult  Post Acute Care Choice:  NA Choice offered to:  NA  DME Arranged:    DME Agency:     HH Arranged:    HH Agency:     Status of Service:  In process, will continue to follow  If discussed at Long Length of Stay Meetings, dates discussed:    Additional Comments:  Sherald Barge, RN 09/08/2016, 2:13 PM

## 2016-09-09 DIAGNOSIS — I480 Paroxysmal atrial fibrillation: Secondary | ICD-10-CM

## 2016-09-09 MED ORDER — DILTIAZEM HCL ER COATED BEADS 120 MG PO CP24
120.0000 mg | ORAL_CAPSULE | Freq: Every day | ORAL | Status: DC
  Administered 2016-09-09 – 2016-09-10 (×2): 120 mg via ORAL
  Filled 2016-09-09 (×2): qty 1

## 2016-09-09 MED ORDER — AZITHROMYCIN 250 MG PO TABS
500.0000 mg | ORAL_TABLET | Freq: Every day | ORAL | Status: DC
  Administered 2016-09-10: 500 mg via ORAL
  Filled 2016-09-09: qty 2

## 2016-09-09 NOTE — Progress Notes (Signed)
2115 Telemetry box and wiring changed out d/t telemetry box not working properly. Telemetry box # 03 applied and working properly at this time confirmed with [place] at [employer].

## 2016-09-09 NOTE — Progress Notes (Signed)
Subjective: He is feeling better today. He is coughing up a limited amount of sputum. He had some confusion last night.  Objective: Vital signs in last 24 hours: Vitals:   09/09/16 0000 09/09/16 0400 09/09/16 0500 09/09/16 0641  BP:    132/76  Pulse:      Resp:      Temp: 97.6 F (36.4 C) 97.4 F (36.3 C)    TempSrc: Oral Oral    SpO2:      Weight:   175 lb 14.8 oz (79.8 kg)   Height:       Weight change: -8 lb 1.2 oz (-3.662 kg)  Intake/Output Summary (Last 24 hours) at 09/09/16 0735 Last data filed at 09/09/16 0500  Gross per 24 hour  Intake             1260 ml  Output              450 ml  Net              810 ml    Physical Exam: Alert and oriented now. Lungs reveal mild rhonchi. Heart regular with no murmurs. Abdomen soft and nontender. Neuro stable.  Telemetry now reveals normal sinus rhythm in the 60s.  Lab Results:   No results found for this or any previous visit (from the past 24 hour(s)).   ABGS No results for input(s): PHART, PO2ART, TCO2, HCO3 in the last 72 hours.  Invalid input(s): PCO2 CULTURES Recent Results (from the past 240 hour(s))  MRSA PCR Screening     Status: None   Collection Time: 09/07/16 11:20 AM  Result Value Ref Range Status   MRSA by PCR NEGATIVE NEGATIVE Final    Comment:        The GeneXpert MRSA Assay (FDA approved for NASAL specimens only), is one component of a comprehensive MRSA colonization surveillance program. It is not intended to diagnose MRSA infection nor to guide or monitor treatment for MRSA infections.   Culture, blood (routine x 2) Call MD if unable to obtain prior to antibiotics being given     Status: None (Preliminary result)   Collection Time: 09/07/16 11:27 AM  Result Value Ref Range Status   Specimen Description BLOOD RIGHT ANTECUBITAL  Final   Special Requests BOTTLES DRAWN AEROBIC AND ANAEROBIC 6CC  Final   Culture NO GROWTH < 24 HOURS  Final   Report Status PENDING  Incomplete  Culture, blood  (routine x 2) Call MD if unable to obtain prior to antibiotics being given     Status: None (Preliminary result)   Collection Time: 09/07/16 11:40 AM  Result Value Ref Range Status   Specimen Description BLOOD BLOOD RIGHT HAND  Final   Special Requests BOTTLES DRAWN AEROBIC AND ANAEROBIC 6CC  Final   Culture NO GROWTH < 24 HOURS  Final   Report Status PENDING  Incomplete  Culture, respiratory (NON-Expectorated)     Status: None (Preliminary result)   Collection Time: 09/07/16  8:35 PM  Result Value Ref Range Status   Specimen Description EXPECTORATED SPUTUM  Final   Special Requests NONE  Final   Gram Stain   Final    NO WBC SEEN MODERATE BUDDING YEAST SEEN HYPHAL ELEMENTS SEEN FEW GRAM POSITIVE RODS FEW GRAM NEGATIVE RODS FEW GRAM NEGATIVE COCCOBACILLI FEW GRAM POSITIVE COCCI IN PAIRS IN SINGLES    Culture PENDING  Incomplete   Report Status PENDING  Incomplete   Studies/Results: Dg Chest 2 View  Result Date: 09/07/2016  CLINICAL DATA:  82 year old male with history of cough since May 19, 2016, worsening today. Shortness of breath. EXAM: CHEST  2 VIEW COMPARISON:  Chest x-ray 09/05/2016. FINDINGS: Diffuse peribronchial cuffing. Lung volumes are normal. No consolidative airspace disease. No pleural effusions. No pneumothorax. No pulmonary nodule or mass noted. Pulmonary vasculature and the cardiomediastinal silhouette are within normal limits. Aortic atherosclerosis. IMPRESSION: 1. Diffuse peribronchial cuffing may suggest an acute bronchitis. 2. Aortic atherosclerosis. Electronically Signed   By: Vinnie Langton M.D.   On: 09/07/2016 08:50   Micro Results: Recent Results (from the past 240 hour(s))  MRSA PCR Screening     Status: None   Collection Time: 09/07/16 11:20 AM  Result Value Ref Range Status   MRSA by PCR NEGATIVE NEGATIVE Final    Comment:        The GeneXpert MRSA Assay (FDA approved for NASAL specimens only), is one component of a comprehensive MRSA  colonization surveillance program. It is not intended to diagnose MRSA infection nor to guide or monitor treatment for MRSA infections.   Culture, blood (routine x 2) Call MD if unable to obtain prior to antibiotics being given     Status: None (Preliminary result)   Collection Time: 09/07/16 11:27 AM  Result Value Ref Range Status   Specimen Description BLOOD RIGHT ANTECUBITAL  Final   Special Requests BOTTLES DRAWN AEROBIC AND ANAEROBIC 6CC  Final   Culture NO GROWTH < 24 HOURS  Final   Report Status PENDING  Incomplete  Culture, blood (routine x 2) Call MD if unable to obtain prior to antibiotics being given     Status: None (Preliminary result)   Collection Time: 09/07/16 11:40 AM  Result Value Ref Range Status   Specimen Description BLOOD BLOOD RIGHT HAND  Final   Special Requests BOTTLES DRAWN AEROBIC AND ANAEROBIC 6CC  Final   Culture NO GROWTH < 24 HOURS  Final   Report Status PENDING  Incomplete  Culture, respiratory (NON-Expectorated)     Status: None (Preliminary result)   Collection Time: 09/07/16  8:35 PM  Result Value Ref Range Status   Specimen Description EXPECTORATED SPUTUM  Final   Special Requests NONE  Final   Gram Stain   Final    NO WBC SEEN MODERATE BUDDING YEAST SEEN HYPHAL ELEMENTS SEEN FEW GRAM POSITIVE RODS FEW GRAM NEGATIVE RODS FEW GRAM NEGATIVE COCCOBACILLI FEW GRAM POSITIVE COCCI IN PAIRS IN SINGLES    Culture PENDING  Incomplete   Report Status PENDING  Incomplete   Studies/Results: Dg Chest 2 View  Result Date: 09/07/2016 CLINICAL DATA:  82 year old male with history of cough since May 19, 2016, worsening today. Shortness of breath. EXAM: CHEST  2 VIEW COMPARISON:  Chest x-ray 09/05/2016. FINDINGS: Diffuse peribronchial cuffing. Lung volumes are normal. No consolidative airspace disease. No pleural effusions. No pneumothorax. No pulmonary nodule or mass noted. Pulmonary vasculature and the cardiomediastinal silhouette are within normal limits.  Aortic atherosclerosis. IMPRESSION: 1. Diffuse peribronchial cuffing may suggest an acute bronchitis. 2. Aortic atherosclerosis. Electronically Signed   By: Vinnie Langton M.D.   On: 09/07/2016 08:50   Medications:  I have reviewed the patient's current medications Scheduled Meds: . azithromycin  500 mg Intravenous Q24H  . cefTRIAXone (ROCEPHIN)  IV  1 g Intravenous Q24H  . diltiazem  30 mg Oral Q6H  . ipratropium  0.5 mg Nebulization QID  . levalbuterol  0.63 mg Nebulization QID  . mouth rinse  15 mL Mouth Rinse BID  . methylPREDNISolone  sodium succinate  60 mg Intravenous Q12H  . metoprolol succinate  25 mg Oral Daily  . off the beat book   Does not apply Once  . pantoprazole  40 mg Oral Daily  . rivaroxaban  20 mg Oral Q supper   Continuous Infusions:  PRN Meds:.levalbuterol   Assessment/Plan: #1. Community acquired pneumonia/COPD exacerbation. Improved. Continue Rocephin and Zithromax. Continue soap and X and Atrovent. Continue Solu-Medrol. Move to a regular room. Ambulate today. #2. Atrial fibrillation/flutter. He has converted to sinus rhythm. Continue metoprolol ER, diltiazem and Xarelto. Appreciate cardiology consult. He is euthyroid. Echo reveals normal LV function and no wall motion abnormalities. There are no significant valvular abnormalities identified. Principal Problem:   CAP (community acquired pneumonia) Active Problems:   EROSIVE ESOPHAGITIS   Diaphragmatic hernia   Diverticulosis of colon   Atrial flutter ([place])   AKI (acute kidney injury) ([place])   PNA (pneumonia)     LOS: 2 days   Connie Lasater 09/09/2016, 7:35 AM

## 2016-09-09 NOTE — Progress Notes (Signed)
Called report to [phone] RN and then transported Mr. Ganser to [phone] by wheelchair.

## 2016-09-09 NOTE — Progress Notes (Signed)
Consulting cardiologist: Dr. Satira Sark  Seen for followup: Atrial fibrillation  Subjective:    Sitting in bedside chair. Feels better, improved cough, no palpitations or chest pain.  Objective:   Temp:  [97 F (36.1 C)-97.6 F (36.4 C)] 97.4 F (36.3 C) (10/03 0400) Resp:  [19-25] 25 (10/02 1800) BP: (124-140)/(71-79) 132/76 (10/03 0641) SpO2:  [90 %-96 %] 94 % (10/02 1922) FiO2 (%):  [28 %] 28 % (10/02 1500) Weight:  [175 lb 14.8 oz (79.8 kg)] 175 lb 14.8 oz (79.8 kg) (10/03 0500) Last BM Date: 09/07/16 (per pt)  Filed Weights   09/07/16 1121 09/08/16 0500 09/09/16 0500  Weight: 171 lb 8.3 oz (77.8 kg) 176 lb 12.9 oz (80.2 kg) 175 lb 14.8 oz (79.8 kg)    Intake/Output Summary (Last 24 hours) at 09/09/16 0828 Last data filed at 09/09/16 0500  Gross per 24 hour  Intake              900 ml  Output              450 ml  Net              450 ml    Telemetry: Normal sinus rhythm.  Exam:  General: Appears comfortable at rest.  Lungs: Coarse breath sounds with prolonged expiratory phase.  Cardiac: RRR without gallop.  Extremities: No pitting edema.  Lab Results:  Basic Metabolic Panel:  Recent Labs Lab 09/07/16 0749 09/07/16 1127  NA 138  --   K 3.7  --   CL 105  --   CO2 26  --   GLUCOSE 136*  --   BUN 24*  --   CREATININE 1.12  --   CALCIUM 8.7*  --   MG  --  1.9    CBC:  Recent Labs Lab 09/07/16 0749 09/08/16 0444  WBC 12.3* 12.7*  HGB 14.0 12.9*  HCT 40.5 37.6*  MCV 85.8 85.3  PLT 205 233    Cardiac Enzymes:  Recent Labs Lab 09/07/16 1127 09/07/16 1701 09/07/16 2351  TROPONINI <0.03 0.03* <0.03    Echocardiogram 09/08/2016: Study Conclusions  - Left ventricle: The cavity size was normal. Wall thickness was at   the upper limits of normal. Systolic function was normal. The   estimated ejection fraction was in the range of 60% to 65%. Wall   motion was normal; there were no regional wall motion   abnormalities.  Left ventricular diastolic function parameters   were normal. - Aortic valve: Mildly calcified annulus. Trileaflet. - Mitral valve: There was trivial regurgitation. - Right atrium: Central venous pressure (est): 15 mm Hg. - Atrial septum: No defect or patent foramen ovale was identified. - Tricuspid valve: There was trivial regurgitation. - Pulmonary arteries: PA peak pressure: 42 mm Hg (S). - Pericardium, extracardiac: There was no pericardial effusion.  Impressions:  - Upper normal LV wall thickness with LVEF 60-65%. Grossly normal   diastolic function. Trivial mitral regurgitation. Mildly   calcified aortic annulus. Trivial tricuspid regurgitation with   PASP 42 mmHg.   Medications:   Scheduled Medications: . azithromycin  500 mg Intravenous Q24H  . cefTRIAXone (ROCEPHIN)  IV  1 g Intravenous Q24H  . diltiazem  30 mg Oral Q6H  . ipratropium  0.5 mg Nebulization QID  . levalbuterol  0.63 mg Nebulization QID  . mouth rinse  15 mL Mouth Rinse BID  . methylPREDNISolone sodium succinate  60 mg Intravenous Q12H  . metoprolol succinate  25 mg Oral Daily  . off the beat book   Does not apply Once  . pantoprazole  40 mg Oral Daily  . rivaroxaban  20 mg Oral Q supper      PRN Medications:  levalbuterol   Assessment:   1. Newly documented atrial fibrillation/flutter with CHADSVASC score 3. Patient has spontaneously converted to sinus rhythm with heart rate control. LVEF 60-65% by echocardiogram.  2. Community acquired pneumonia/bronchitis, improving on therapy per primary team.  3. Essential hypertension, blood pressure is adequately controlled.   Plan/Discussion:    Plan to switch from Cardizem 30 mg by mouth every 6 hours to Cardizem CD 120 mg by mouth daily. Would continue Xarelto for now in case he actually has PAF, will reassess as an outpatient. Stop aspirin as outpatient. Office follow-up is being arranged for the next few weeks. We will sign off for  now.   Satira Sark, M.D., F.A.C.C.

## 2016-09-10 LAB — CULTURE, RESPIRATORY: GRAM STAIN: NONE SEEN

## 2016-09-10 LAB — BASIC METABOLIC PANEL
ANION GAP: 7 (ref 5–15)
BUN: 37 mg/dL — AB (ref 6–20)
CHLORIDE: 102 mmol/L (ref 101–111)
CO2: 26 mmol/L (ref 22–32)
Calcium: 8.3 mg/dL — ABNORMAL LOW (ref 8.9–10.3)
Creatinine, Ser: 1.02 mg/dL (ref 0.61–1.24)
Glucose, Bld: 148 mg/dL — ABNORMAL HIGH (ref 65–99)
POTASSIUM: 3.9 mmol/L (ref 3.5–5.1)
SODIUM: 135 mmol/L (ref 135–145)

## 2016-09-10 LAB — CULTURE, RESPIRATORY W GRAM STAIN: Culture: NORMAL

## 2016-09-10 MED ORDER — CEFUROXIME AXETIL 500 MG PO TABS: 500.0000 mg | ORAL_TABLET | Freq: Two times a day (BID) | ORAL | 0 refills | Status: DC

## 2016-09-10 MED ORDER — DILTIAZEM HCL ER COATED BEADS 120 MG PO CP24: 120.0000 mg | ORAL_CAPSULE | Freq: Every day | ORAL | 12 refills | Status: DC

## 2016-09-10 MED ORDER — RIVAROXABAN 20 MG PO TABS: 20.0000 mg | ORAL_TABLET | Freq: Every day | ORAL | 12 refills | Status: DC

## 2016-09-10 MED ORDER — PREDNISONE 20 MG PO TABS: 20.0000 mg | ORAL_TABLET | Freq: Every day | ORAL | 0 refills | Status: DC

## 2016-09-10 MED ORDER — ALBUTEROL SULFATE HFA 108 (90 BASE) MCG/ACT IN AERS: 2.0000 | INHALATION_SPRAY | Freq: Four times a day (QID) | RESPIRATORY_TRACT | 2 refills | Status: DC

## 2016-09-10 NOTE — Discharge Summary (Signed)
Physician Discharge Summary  ELISA JEANNOT [id number] DOB: 08/14/1933 DOA: 09/07/2016   Admit date: 09/07/2016 Discharge date: 09/10/2016  Discharge Diagnoses:  Principal Problem:   CAP (community acquired pneumonia) Active Problems:   EROSIVE ESOPHAGITIS   Diaphragmatic hernia   Diverticulosis of colon   Atrial flutter ([place])   AKI (acute kidney injury) ([place])   PNA (pneumonia)    Wt Readings from Last 3 Encounters:  09/10/16 176 lb 11.2 oz (80.2 kg)  01/19/14 186 lb 9.6 oz (84.6 kg)  09/24/12 190 lb 3.2 oz (86.3 kg)     Hospital Course:  This patient is an 82 year old male who presented with cough and shortness of breath. He had a low-grade fever. He had a leukocytosis in the 12,000 range. A chest x-ray prior to hospitalization revealed possible pneumonia changes however his chest x-ray on admission was felt to be consistent with bronchitis. He was treated with Rocephin and Zithromax. He received Solu-Medrol, ipratropium and Xopenex nebulizers and supplemental oxygen. Cough and wheezing improved. Blood cultures were negative. Streptococcal urinary antigen was negative.  On presentation he was in atrial flutter/fibrillation. This was new. He is euthyroid. His echo revealed normal left ventricular function with no wall motion abnormalities or significant valvular abnormalities. He was treated with diltiazem and metoprolol. He converted to sinus rhythm and maintained sinus rhythm after conversion. He was seen in consultation by cardiology. Xarelto was initiated.  Respiratory status improved and he was stable for discharge on the morning of 10/4. Condition at discharge is much improved. He will be seen in follow-up in my office in one week. Medications were reviewed in detail with the patient and his wife. He will continue antibiotic therapy with cefuroxime orally. He will continue corticosteroids in the form of oral prednisone which will be tapered. He will continue metoprolol and  diltiazem ER.   Discharge Instructions     Medication List    STOP taking these medications   aspirin 81 MG tablet     TAKE these medications   albuterol 108 (90 Base) MCG/ACT inhaler Commonly known as:  PROVENTIL HFA;VENTOLIN HFA Inhale 2 puffs into the lungs 4 (four) times daily.   cefUROXime 500 MG tablet Commonly known as:  CEFTIN Take 1 tablet (500 mg total) by mouth 2 (two) times daily with a meal.   diltiazem 120 MG 24 hr capsule Commonly known as:  CARDIZEM CD Take 1 capsule (120 mg total) by mouth daily.   docusate sodium 100 MG capsule Commonly known as:  COLACE Take 100 mg by mouth daily.   lisinopril-hydrochlorothiazide 10-12.5 MG tablet Commonly known as:  PRINZIDE,ZESTORETIC Take 1 tablet by mouth daily.   metoprolol succinate 25 MG 24 hr tablet Commonly known as:  TOPROL-XL Take 25 mg by mouth daily.   omeprazole 20 MG capsule Commonly known as:  PRILOSEC Take 1 capsule (20 mg total) by mouth daily.   predniSONE 20 MG tablet Commonly known as:  DELTASONE Take 1 tablet (20 mg total) by mouth daily with breakfast.   psyllium 58.6 % packet Commonly known as:  METAMUCIL Take 1 packet by mouth daily.   rivaroxaban 20 MG Tabs tablet Commonly known as:  XARELTO Take 1 tablet (20 mg total) by mouth daily with supper.        Kharizma Lesnick 09/10/2016

## 2016-09-12 LAB — CULTURE, BLOOD (ROUTINE X 2)
CULTURE: NO GROWTH
CULTURE: NO GROWTH

## 2016-09-17 DIAGNOSIS — I4892 Unspecified atrial flutter: Secondary | ICD-10-CM | POA: Diagnosis not present

## 2016-09-17 DIAGNOSIS — Z23 Encounter for immunization: Secondary | ICD-10-CM | POA: Diagnosis not present

## 2016-09-22 ENCOUNTER — Encounter: Payer: Self-pay | Admitting: Adult Health

## 2016-09-22 ENCOUNTER — Ambulatory Visit (INDEPENDENT_AMBULATORY_CARE_PROVIDER_SITE_OTHER): Payer: Medicare Other | Admitting: Adult Health

## 2016-09-22 VITALS — BP 104/64 | HR 60 | Ht 71.0 in | Wt 175.8 lb

## 2016-09-22 DIAGNOSIS — I480 Paroxysmal atrial fibrillation: Secondary | ICD-10-CM

## 2016-09-22 NOTE — Patient Instructions (Signed)
Medication Instructions:  Your physician recommends that you continue on your current medications as directed. Please refer to the Current Medication list given to you today.   Labwork: NONE  Testing/Procedures: NONE  Follow-Up: Your physician recommends that you schedule a follow-up appointment in: 4 MONTHS    Any Other Special Instructions Will Be Listed Below (If Applicable).     If you need a refill on your cardiac medications before your next appointment, please call your pharmacy.   
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
## 2016-09-22 NOTE — Progress Notes (Signed)
Name: Glenn Bean    DOB: 09-Apr-1933  Age: 82 y.o.  MR#: [id number]       PCP:  Asencion Noble, MD      Insurance: Payor: MEDICARE / Plan: MEDICARE PART A AND B / Product Type: *No Product type* /   CC:   No chief complaint on file.   VS Vitals:   09/22/16 1424  Pulse: 60  SpO2: 94%  Weight: 175 lb 12.8 oz (79.7 kg)  Height: 5\' 11"  (1.803 m)    Weights Current Weight  09/22/16 175 lb 12.8 oz (79.7 kg)  09/10/16 176 lb 11.2 oz (80.2 kg)  01/19/14 186 lb 9.6 oz (84.6 kg)    Blood Pressure  BP Readings from Last 3 Encounters:  09/10/16 132/80  01/19/14 (!) 127/103  09/24/12 120/80     Admit date:  (Not on file) Last encounter with RMR:  Visit date not found   Allergy Review of patient's allergies indicates no known allergies.  Current Outpatient Prescriptions  Medication Sig Dispense Refill  . albuterol (PROVENTIL HFA;VENTOLIN HFA) 108 (90 Base) MCG/ACT inhaler Inhale 2 puffs into the lungs 4 (four) times daily. 1 Inhaler 2  . cefUROXime (CEFTIN) 500 MG tablet Take 1 tablet (500 mg total) by mouth 2 (two) times daily with a meal. 10 tablet 0  . docusate sodium (COLACE) 100 MG capsule Take 100 mg by mouth daily.    Marland Kitchen omeprazole (PRILOSEC) 20 MG capsule Take 1 capsule (20 mg total) by mouth daily. 90 capsule 0  . predniSONE (DELTASONE) 20 MG tablet Take 1 tablet (20 mg total) by mouth daily with breakfast. 30 tablet 0  . psyllium (METAMUCIL) 58.6 % packet Take 1 packet by mouth daily.    . rivaroxaban (XARELTO) 20 MG TABS tablet Take 1 tablet (20 mg total) by mouth daily with supper. 30 tablet 12   No current facility-administered medications for this visit.     Discontinued Meds:    Medications Discontinued During This Encounter  Medication Reason  . diltiazem (CARDIZEM CD) 120 MG 24 hr capsule Error  . lisinopril-hydrochlorothiazide (PRINZIDE,ZESTORETIC) 10-12.5 MG per tablet Error  . metoprolol succinate (TOPROL-XL) 25 MG 24 hr tablet Error    Patient Active  Problem List   Diagnosis Date Noted  . CAP (community acquired pneumonia) 09/07/2016  . Atrial flutter ([place]) 09/07/2016  . AKI (acute kidney injury) ([place]) 09/07/2016  . PNA (pneumonia) 09/07/2016  . Subdural hematoma, chronic ([place]) 11/28/2011  . Neck pain 10/08/2011  . Neck muscle weakness 10/08/2011  . Neck stiffness 10/08/2011  . 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 Esophageal reflux 02/27/2010  . COLONIC POLYPS 04/21/2008  . HEMORRHOIDS, INTERNAL 04/21/2008  . EROSIVE ESOPHAGITIS 04/21/2008  . ESOPHAGEAL STRICTURE 04/21/2008  . Diaphragmatic hernia 04/21/2008  . Diverticulosis of colon 04/21/2008    LABS    Component Value Date/Time   NA 135 09/10/2016 0622   NA 138 09/07/2016 0749   NA 138 11/28/2011 1709   K 3.9 09/10/2016 0622   K 3.7 09/07/2016 0749   K 3.5 11/28/2011 1709   CL 102 09/10/2016 0622   CL 105 09/07/2016 0749   CL 100 11/28/2011 1709   CO2 26 09/10/2016 0622   CO2 26 09/07/2016 0749   CO2 29 11/28/2011 1709   GLUCOSE 148 (H) 09/10/2016 0622   GLUCOSE 136 (H) 09/07/2016 0749   GLUCOSE 91 11/28/2011 1709   BUN 37 (H) 09/10/2016 0622   BUN 24 (H) 09/07/2016 0749   BUN  20 11/28/2011 1709   CREATININE 1.02 09/10/2016 0622   CREATININE 1.12 09/07/2016 0749   CREATININE 1.10 11/28/2011 1709   CALCIUM 8.3 (L) 09/10/2016 0622   CALCIUM 8.7 (L) 09/07/2016 0749   CALCIUM 10.0 11/28/2011 1709   GFRNONAA >60 09/10/2016 0622   GFRNONAA 60 (L) 09/07/2016 0749   GFRNONAA 63 (L) 11/28/2011 1709   GFRAA >60 09/10/2016 0622   GFRAA >60 09/07/2016 0749   GFRAA 73 (L) 11/28/2011 1709   CMP     Component Value Date/Time   NA 135 09/10/2016 0622   K 3.9 09/10/2016 0622   CL 102 09/10/2016 0622   CO2 26 09/10/2016 0622   GLUCOSE 148 (H) 09/10/2016 0622   BUN 37 (H) 09/10/2016 0622   CREATININE 1.02 09/10/2016 0622   CALCIUM 8.3 (L) 09/10/2016 0622   PROT 6.6 11/28/2011 1709   ALBUMIN 3.7 11/28/2011 1709   AST 15 11/28/2011 1709   ALT 15  11/28/2011 1709   ALKPHOS 75 11/28/2011 1709   BILITOT 0.6 11/28/2011 1709   GFRNONAA >60 09/10/2016 0622   GFRAA >60 09/10/2016 0622       Component Value Date/Time   WBC 12.7 (H) 09/08/2016 0444   WBC 12.3 (H) 09/07/2016 0749   WBC 8.1 11/28/2011 1709   HGB 12.9 (L) 09/08/2016 0444   HGB 14.0 09/07/2016 0749   HGB 13.8 11/28/2011 1709   HCT 37.6 (L) 09/08/2016 0444   HCT 40.5 09/07/2016 0749   HCT 39.0 11/28/2011 1709   MCV 85.3 09/08/2016 0444   MCV 85.8 09/07/2016 0749   MCV 84.6 11/28/2011 1709    Lipid Panel  No results found for: CHOL, TRIG, HDL, CHOLHDL, VLDL, LDLCALC, LDLDIRECT  ABG No results found for: PHART, PCO2ART, PO2ART, HCO3, TCO2, ACIDBASEDEF, O2SAT   Lab Results  Component Value Date   TSH 1.956 09/07/2016   BNP (last 3 results)  Recent Labs  09/07/16 0749  BNP 266.0*    ProBNP (last 3 results) No results for input(s): PROBNP in the last 8760 hours.  Cardiac Panel (last 3 results) No results for input(s): CKTOTAL, CKMB, TROPONINI, RELINDX in the last 72 hours.  Iron/TIBC/Ferritin/ %Sat    Component Value Date/Time   IRON 57 02/27/2010 1147   FERRITIN 195.7 02/27/2010 1147   IRONPCTSAT 20.5 02/27/2010 1147     EKG Orders placed or performed during the hospital encounter of 09/07/16  . EKG 12-Lead  . EKG 12-Lead  . EKG 12-Lead  . EKG 12-Lead     Prior Assessment and Plan Problem List as of 09/22/2016 Reviewed: 09/24/2012 11:26 AM by Verl Blalock, MD     Cardiovascular and Mediastinum   HEMORRHOIDS, INTERNAL   Atrial flutter ([place])     Respiratory   Diaphragmatic hernia   CAP (community acquired pneumonia)   PNA (pneumonia)     Digestive   COLONIC POLYPS   EROSIVE ESOPHAGITIS   ESOPHAGEAL STRICTURE   Esophageal reflux   Diverticulosis of colon     Nervous and Auditory   Subdural hematoma, chronic ([facility])     Genitourinary   MALIGNANT NEOPLASM OF PROSTATE   AKI (acute kidney injury) ([place])     Other   Neck pain    Neck muscle weakness   Neck stiffness       Imaging: Dg Chest 2 View  Result Date: 09/07/2016 CLINICAL DATA:  82 year old male with history of cough since May 19, 2016, worsening today. Shortness of breath. EXAM: CHEST  2 VIEW COMPARISON:  Chest  x-ray 09/05/2016. FINDINGS: Diffuse peribronchial cuffing. Lung volumes are normal. No consolidative airspace disease. No pleural effusions. No pneumothorax. No pulmonary nodule or mass noted. Pulmonary vasculature and the cardiomediastinal silhouette are within normal limits. Aortic atherosclerosis. IMPRESSION: 1. Diffuse peribronchial cuffing may suggest an acute bronchitis. 2. Aortic atherosclerosis. Electronically Signed   By: Vinnie Langton M.D.   On: 09/07/2016 08:50   Dg Chest 2 View  Result Date: 09/05/2016 CLINICAL DATA:  Six months of cough without clinical improvement. History of hypertension gastroesophageal reflux. Former heavy smoker. EXAM: CHEST  2 VIEW COMPARISON:  PA and lateral chest x-ray of November 28, 2011 FINDINGS: The lungs are mildly hyperinflated. The interstitial markings are chronically increased but are overall slightly more conspicuous than on the previous study. There is focal increased interstitial density in the left lower lobe as compared to the previous study. There is no alveolar infiltrate or pleural effusion. The heart and pulmonary vascularity are normal. There is calcification within the wall of the aortic arch. There is calcification of portions of the anterior longitudinal ligament of the thoracic spine. IMPRESSION: COPD. Increased lung markings over baseline but greatest in the left lower lobe. This may reflect interstitial pneumonia. Followup PA and lateral chest X-ray is recommended in 3-4 weeks following trial of antibiotic therapy to ensure resolution and exclude underlying malignancy. No CHF.  Aortic atherosclerosis. Electronically Signed   By: David  Martinique M.D.   On: 09/05/2016 15:47

## 2016-10-17 ENCOUNTER — Other Ambulatory Visit (HOSPITAL_COMMUNITY): Payer: Self-pay | Admitting: Internal Medicine

## 2016-10-17 ENCOUNTER — Ambulatory Visit (HOSPITAL_COMMUNITY)
Admission: RE | Admit: 2016-10-17 | Discharge: 2016-10-17 | Disposition: A | Discharge status: Home / Self Care | Payer: Medicare Other | Source: Ambulatory Visit | Attending: Internal Medicine | Admitting: Internal Medicine

## 2016-10-17 DIAGNOSIS — R05 Cough: Secondary | ICD-10-CM

## 2016-10-17 DIAGNOSIS — J441 Chronic obstructive pulmonary disease with (acute) exacerbation: Secondary | ICD-10-CM | POA: Diagnosis not present

## 2016-10-17 DIAGNOSIS — J449 Chronic obstructive pulmonary disease, unspecified: Secondary | ICD-10-CM | POA: Insufficient documentation

## 2016-10-17 DIAGNOSIS — R059 Cough, unspecified: Secondary | ICD-10-CM

## 2016-10-17 DIAGNOSIS — I480 Paroxysmal atrial fibrillation: Secondary | ICD-10-CM | POA: Diagnosis not present

## 2016-11-14 DIAGNOSIS — J441 Chronic obstructive pulmonary disease with (acute) exacerbation: Secondary | ICD-10-CM | POA: Diagnosis not present

## 2016-11-14 DIAGNOSIS — S46212A Strain of muscle, fascia and tendon of other parts of biceps, left arm, initial encounter: Secondary | ICD-10-CM | POA: Diagnosis not present

## 2016-11-14 DIAGNOSIS — I480 Paroxysmal atrial fibrillation: Secondary | ICD-10-CM | POA: Diagnosis not present

## 2016-12-04 DIAGNOSIS — J209 Acute bronchitis, unspecified: Secondary | ICD-10-CM | POA: Diagnosis not present

## 2016-12-23 DIAGNOSIS — M75122 Complete rotator cuff tear or rupture of left shoulder, not specified as traumatic: Secondary | ICD-10-CM | POA: Diagnosis not present

## 2017-01-14 ENCOUNTER — Institutional Professional Consult (permissible substitution): Payer: Self-pay | Admitting: Internal Medicine

## 2017-01-16 ENCOUNTER — Ambulatory Visit (INDEPENDENT_AMBULATORY_CARE_PROVIDER_SITE_OTHER): Payer: Medicare Other | Admitting: Internal Medicine

## 2017-01-16 ENCOUNTER — Encounter: Payer: Self-pay | Admitting: Internal Medicine

## 2017-01-16 DIAGNOSIS — R0602 Shortness of breath: Secondary | ICD-10-CM

## 2017-01-16 DIAGNOSIS — R05 Cough: Secondary | ICD-10-CM

## 2017-01-16 DIAGNOSIS — R058 Other specified cough: Secondary | ICD-10-CM

## 2017-01-16 NOTE — Patient Instructions (Addendum)
You should gradually improve over the 4-6 weeks from your last lisinopril pill  And should not restart it   Leave the advair off for now- if getting worse start back on it  Prilosec 20 x 2 Take 30-60 min before first meal of the day and Pepcid ac 20 mg over the counter at bedtime until 100% better then go back to what you were taking before   GERD (REFLUX)  is an extremely common cause of respiratory symptoms just like yours , many times with no obvious heartburn at all.    It can be treated with medication, but also with lifestyle changes including elevation of the head of your bed (ideally with 6 inch  bed blocks),  Smoking cessation, avoidance of late meals, excessive alcohol, and avoid fatty foods, chocolate, peppermint, colas, red wine, and acidic juices such as orange juice.  NO MINT OR MENTHOL PRODUCTS SO NO COUGH DROPS   USE SUGARLESS CANDY INSTEAD (Jolley ranchers or Stover's or Life Savers) or even ice chips will also do - the key is to swallow to prevent all throat clearing. NO OIL BASED VITAMINS - use powdered substitutes.   If you are satisfied with your treatment plan,  let your doctor know and he/she can either refill your medications or you can return here when your prescription runs out.     If in any way you are not 100% satisfied,  please tell us.  If 100% better, tell your friends!  Pulmonary follow up is as needed  - if need to return you will need to bring every one of your medications with you including over the counters Late add:  Check sinus CT and repeat cxr 01/20/16 - can be done at [facility]

## 2017-01-16 NOTE — Progress Notes (Signed)
Subjective:     Patient ID: Glenn Bean, male   DOB: 1933/08/22,     MRN: [id number]  HPI  60 yowm quit smoking pipe with some chest discomfort in the 1990s and resolved then late summer 2017 indolent onset of dry cough progressively worse > started becoming more productive eventually yellow much worse and admitted   Admit date: 09/07/2016 Discharge date: 09/10/2016  Discharge Diagnoses:  Principal Problem:   CAP (community acquired pneumonia) Active Problems:   EROSIVE ESOPHAGITIS   Diaphragmatic hernia   Diverticulosis of colon   Atrial flutter ([place])   AKI (acute kidney injury) ([place])   PNA (pneumonia)       Wt Readings from Last 3 Encounters:  09/10/16 176 lb 11.2 oz (80.2 kg)  01/19/14 186 lb 9.6 oz (84.6 kg)  09/24/12 190 lb 3.2 oz (86.3 kg)     Hospital Course:  This patient is an 83 year old male who presented with cough and shortness of breath. He had a low-grade fever. He had a leukocytosis in the 12,000 range. A chest x-ray prior to hospitalization revealed possible pneumonia changes however his chest x-ray on admission was felt to be consistent with bronchitis. He was treated with Rocephin and Zithromax. He received Solu-Medrol, ipratropium and Xopenex nebulizers and supplemental oxygen. Cough and wheezing improved. Blood cultures were negative. Streptococcal urinary antigen was negative.  On presentation he was in atrial flutter/fibrillation. This was new. He is euthyroid. His echo revealed normal left ventricular function with no wall motion abnormalities or significant valvular abnormalities. He was treated with diltiazem and metoprolol. He converted to sinus rhythm and maintained sinus rhythm after conversion. He was seen in consultation by cardiology. Xarelto was initiated.  Respiratory status improved and he was stable for discharge on the morning of 10/4. Condition at discharge is much improved. He will be seen in follow-up in my office in one week.  Medications were reviewed in detail with the patient and his wife. He will continue antibiotic therapy with cefuroxime orally. He will continue corticosteroids in the form of oral prednisone which will be tapered. He will continue metoprolol and diltiazem ER.   Discharge Instructions    Medication List    STOP taking these medications   aspirin 81 MG tablet     TAKE these medications   albuterol 108 (90 Base) MCG/ACT inhaler Commonly known as:  PROVENTIL HFA;VENTOLIN HFA Inhale 2 puffs into the lungs 4 (four) times daily.   cefUROXime 500 MG tablet Commonly known as:  CEFTIN Take 1 tablet (500 mg total) by mouth 2 (two) times daily with a meal.   diltiazem 120 MG 24 hr capsule Commonly known as:  CARDIZEM CD Take 1 capsule (120 mg total) by mouth daily.   docusate sodium 100 MG capsule Commonly known as:  COLACE Take 100 mg by mouth daily.   lisinopril-hydrochlorothiazide 10-12.5 MG tablet Commonly known as:  PRINZIDE,ZESTORETIC Take 1 tablet by mouth daily.   metoprolol succinate 25 MG 24 hr tablet Commonly known as:  TOPROL-XL Take 25 mg by mouth daily.   omeprazole 20 MG capsule Commonly known as:  PRILOSEC Take 1 capsule (20 mg total) by mouth daily.   predniSONE 20 MG tablet Commonly known as:  DELTASONE Take 1 tablet (20 mg total) by mouth daily with breakfast.   psyllium 58.6 % packet Commonly known as:  METAMUCIL Take 1 packet by mouth daily.   rivaroxaban 20 MG Tabs tablet Commonly known as:  XARELTO Take 1 tablet (20 mg  total) by mouth daily with supper.       F/u CXR nl 10/17/16    01/16/2017 1st [facility] office visit/ Khris Jansson  ? Not clear when stopped lisinopril/ stopped advair  3 d prior to [place]   Chief Complaint  Patient presents with  . Pulmonary Consult    referred by Dr. Willey Blade, terrible cough, deep cough with white/yellow mucus production, SOB when coughing for months  cough no better while on advair / h/o hb on  prilosec 30 min ac/ no longer productive  Wife concerned about noisy breathing much more than pt - also chronic hoarseness Breathing Apparently better since stopped advair but very difficult to sort out because pt's wife tells him when he appears short of breath - he doesn't appear to be aware of it and Not limited by breathing from desired activities  Though very sedentary   No obvious day to day or daytime variability or assoc   mucus plugs or hemoptysis or cp or chest tightness, subjective wheeze or overt sinus or hb symptoms. No unusual exp hx or h/o childhood pna/ asthma or knowledge of premature birth.  Sleeping ok without nocturnal  or early am exacerbation  of respiratory  c/o's or need for noct saba. Also denies any obvious fluctuation of symptoms with weather or environmental changes or other aggravating or alleviating factors except as outlined above   Current Medications, Allergies, Complete Past Medical History, Past Surgical History, Family History, and Social History were reviewed in [employer] record.  ROS  The following are not active complaints unless bolded sore throat, dysphagia, dental problems, itching, sneezing,  nasal congestion or excess/ purulent secretions, ear ache,   fever, chills, sweats, unintended wt loss, classically pleuritic or exertional cp,  orthopnea pnd or leg swelling, presyncope, palpitations, abdominal pain, anorexia, nausea, vomiting, diarrhea  or change in bowel or bladder habits, change in stools or urine, dysuria,hematuria,  rash, arthralgias, visual complaints, headache, numbness, weakness or ataxia or problems with walking or coordination,  change in mood/affect or memory.             Review of Systems     Objective:   Physical Exam    amb wm with classic pseudowheze   Wt Readings from Last 3 Encounters:  01/16/17 182 lb 6.4 oz (82.7 kg)  09/22/16 175 lb 12.8 oz (79.7 kg)  09/10/16 176 lb 11.2 oz (80.2 kg)     Vital signs reviewed - Note on arrival 02 sats  95% on RA     HEENT: nl dentition, turbinates, and oropharynx. Nl external ear canals without cough reflex   NECK :  without JVD/Nodes/TM/ nl carotid upstrokes bilaterally   LUNGS: no acc muscle use,  Nl contour chest which is clear to A and P bilaterally without cough on insp or exp maneuvers   CV:  RRR  no s3 or murmur or increase in P2, nad no edema   ABD:  soft and nontender with nl inspiratory excursion in the supine position. No bruits or organomegaly appreciated, bowel sounds nl  MS:  Nl gait/ ext warm without deformities, calf tenderness, cyanosis or clubbing No obvious joint restrictions   SKIN: warm and dry without lesions    NEURO:  alert, approp, nl sensorium with  no motor or cerebellar deficits apparent.      I personally reviewed images and agree with radiology impression as follows:  CXR:   10/17/16 Stable COPD.  No active disease.  Assessment:

## 2017-01-17 DIAGNOSIS — R058 Other specified cough: Secondary | ICD-10-CM | POA: Insufficient documentation

## 2017-01-17 DIAGNOSIS — R05 Cough: Secondary | ICD-10-CM | POA: Insufficient documentation

## 2017-01-17 NOTE — Assessment & Plan Note (Addendum)
Symptoms are markedly disproportionate to objective findings and not clear this is a lung problem but pt does appear to have difficult airway management issues. DDX of  difficult airways management almost all start with A and  include Adherence, Ace Inhibitors, Acid Reflux, Active Sinus Disease, Alpha 1 Antitripsin deficiency, Anxiety masquerading as Airways dz,  ABPA,  Allergy(esp in young), Aspiration (esp in elderly), Adverse effects of meds,  Active smokers, A bunch of PE's (a small clot burden can't cause this syndrome unless there is already severe underlying pulm or vascular dz with poor reserve) plus two Bs  = Bronchiectasis and Beta blocker use..and one C= CHF   Adherence is always the initial "prime suspect" and is a multilayered concern that requires a "trust but verify" approach in every patient - starting with knowing how to use medications, especially inhalers, correctly, keeping up with refills and understanding the fundamental difference between maintenance and prns vs those medications only taken for a very short course and then stopped and not refilled.  - pt and wife seem very confused about meds and I would not be surprised if still on ACEi  - if not better with all meds in hand using a trust but verify approach to confirm accurate Medication  Reconciliation The principal here is that until we are certain that the  patients are doing what we've asked, it makes no sense to ask them to do more.   ? Acid (or non-acid) GERD > always difficult to exclude as up to 75% of pts in some series report no assoc GI/ Heartburn symptoms> rec max (24h)  acid suppression and diet restrictions/ reviewed and instructions given in writing.    ? Active sinus dz > sinus CT   ? Adverse effects of dpi > try off advair   ? A bunch of PE's > unlikely on xarelto if taking it   ? CHF > echo ok 09/08/2016   Total time devoted to counseling  > 50 % of initial 60 min office visit:  review case with pt/  discussion of options/alternatives/ personally creating written customized instructions  in presence of pt  then going over those specific  Instructions directly with the pt including how to use all of the meds but in particular covering each new medication in detail and the difference between the maintenance= "automatic" meds and the prns using an action plan format for the latter (If this problem/symptom => do that organization reading Left to right).  Please see AVS from this visit for a full list of these instructions which I personally wrote for this pt and  are unique to this visit.

## 2017-01-17 NOTE — Assessment & Plan Note (Signed)
Upper airway cough syndrome (previously labeled PNDS) , is  so named because it's frequently impossible to sort out how much is  CR/sinusitis with freq throat clearing (which can be related to primary GERD)   vs  causing  secondary (" extra esophageal")  GERD from wide swings in gastric pressure that occur with throat clearing, often  promoting self use of mint and menthol lozenges that reduce the lower esophageal sphincter tone and exacerbate the problem further in a cyclical fashion.   These are the same pts (now being labeled as having "irritable larynx syndrome" by some cough centers) who not infrequently have a history of having failed to tolerate ace inhibitors,  dry powder inhalers or biphosphonates or report having atypical/extraesophageal reflux symptoms that don't respond to standard doses of PPI  and are easily confused as having aecopd or asthma flares by even experienced allergists/ pulmonologists (myself included).  First step is be sure he's actually off the lisinopril and max rx gerd while waiting on results of sinus CT   see avs for instructions unique to this ov

## 2017-01-19 ENCOUNTER — Other Ambulatory Visit: Payer: Self-pay | Admitting: Internal Medicine

## 2017-01-19 ENCOUNTER — Telehealth: Payer: Self-pay | Admitting: Internal Medicine

## 2017-01-19 DIAGNOSIS — R05 Cough: Secondary | ICD-10-CM

## 2017-01-19 DIAGNOSIS — R058 Other specified cough: Secondary | ICD-10-CM

## 2017-01-19 NOTE — Telephone Encounter (Signed)
lmtcb X1 to make pt aware of recs.  

## 2017-01-19 NOTE — Telephone Encounter (Signed)
This medication is fine and all the instructions given are the same (was concerned about lisinopril - I realize it was deleted prior to the office visit but still takes 6 weeks to wash out).

## 2017-01-19 NOTE — Telephone Encounter (Signed)
Spoke with, who had some question about his blood pressure medication. Pt is currently taken cartia XT 120. This medication is not listed on pt's med list or on pt's AVS. Pt would like some clarification, as to if he should stop or continue taking the cartia.  MW please advise. Thanks.   01/16/17 Patient Instructions     You should gradually improve over the 4-6 weeks from your last lisinopril pill  And should not restart it   Leave the advair off for now- if getting worse start back on it  Prilosec 20 x 2 Take 30-60 min before first meal of the day and Pepcid ac 20 mg over the counter at bedtime until 100% better then go back to what you were taking before   GERD (REFLUX)  is an extremely common cause of respiratory symptoms just like yours , many times with no obvious heartburn at all.    It can be treated with medication, but also with lifestyle changes including elevation of the head of your bed (ideally with 6 inch  bed blocks),  Smoking cessation, avoidance of late meals, excessive alcohol, and avoid fatty foods, chocolate, peppermint, colas, red wine, and acidic juices such as orange juice.  NO MINT OR MENTHOL PRODUCTS SO NO COUGH DROPS   USE SUGARLESS CANDY INSTEAD (Jolley ranchers or Stover's or Life Savers) or even ice chips will also do - the key is to swallow to prevent all throat clearing. NO OIL BASED VITAMINS - use powdered substitutes.   If you are satisfied with your treatment plan,  let your doctor know and he/she can either refill your medications or you can return here when your prescription runs out.     If in any way you are not 100% satisfied,  please tell us.  If 100% better, tell your friends!  Pulmonary follow up is as needed  - if need to return you will need to bring every one of your medications with you including over the counters Late add:  Check sinus CT and repeat cxr 01/20/16 - can be done at [facility]

## 2017-01-20 NOTE — Telephone Encounter (Signed)
[phone] pt calling back

## 2017-01-20 NOTE — Telephone Encounter (Signed)
Spoke with pt. He is aware of MW's response. Nothing further was needed. 

## 2017-01-23 ENCOUNTER — Ambulatory Visit (INDEPENDENT_AMBULATORY_CARE_PROVIDER_SITE_OTHER): Payer: Medicare Other | Admitting: Cardiology

## 2017-01-23 ENCOUNTER — Other Ambulatory Visit (HOSPITAL_COMMUNITY)
Admission: RE | Admit: 2017-01-23 | Discharge: 2017-01-23 | Disposition: A | Discharge status: Home / Self Care | Payer: Medicare Other | Source: Ambulatory Visit | Attending: Cardiology | Admitting: Cardiology

## 2017-01-23 ENCOUNTER — Encounter: Payer: Self-pay | Admitting: Cardiology

## 2017-01-23 VITALS — BP 130/69 | HR 74 | Ht 71.0 in | Wt 179.0 lb

## 2017-01-23 DIAGNOSIS — I480 Paroxysmal atrial fibrillation: Secondary | ICD-10-CM | POA: Insufficient documentation

## 2017-01-23 DIAGNOSIS — I10 Essential (primary) hypertension: Secondary | ICD-10-CM

## 2017-01-23 DIAGNOSIS — K219 Gastro-esophageal reflux disease without esophagitis: Secondary | ICD-10-CM

## 2017-01-23 LAB — BASIC METABOLIC PANEL
ANION GAP: 7 (ref 5–15)
BUN: 19 mg/dL (ref 6–20)
CALCIUM: 9.3 mg/dL (ref 8.9–10.3)
CO2: 28 mmol/L (ref 22–32)
Chloride: 103 mmol/L (ref 101–111)
Creatinine, Ser: 1.34 mg/dL — ABNORMAL HIGH (ref 0.61–1.24)
GFR calc Af Amer: 56 mL/min — ABNORMAL LOW (ref >60)
GFR, EST NON AFRICAN AMERICAN: 48 mL/min — AB (ref >60)
Glucose, Bld: 127 mg/dL — ABNORMAL HIGH (ref 65–99)
POTASSIUM: 4 mmol/L (ref 3.5–5.1)
SODIUM: 138 mmol/L (ref 135–145)

## 2017-01-23 LAB — CBC
HCT: 39.4 % (ref 39.0–52.0)
Hemoglobin: 13.7 g/dL (ref 13.0–17.0)
MCH: 29.7 pg (ref 26.0–34.0)
MCHC: 34.8 g/dL (ref 30.0–36.0)
MCV: 85.3 fL (ref 78.0–100.0)
PLATELETS: 184 10*3/uL (ref 150–400)
RBC: 4.62 MIL/uL (ref 4.22–5.81)
RDW: 13.9 % (ref 11.5–15.5)
WBC: 9.4 10*3/uL (ref 4.0–10.5)
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
## 2017-01-23 NOTE — Patient Instructions (Signed)
Medication Instructions:   Your physician recommends that you continue on your current medications as directed. Please refer to the Current Medication list given to you today.  Labwork:  Your physician recommends that you return for lab work to check your CBC and BMET. Please have this done today at the lab.  Testing/Procedures: NONE  Follow-Up:  Your physician recommends that you schedule a follow-up appointment in: 4 months. You will receive a reminder letter in the mail in about 2 months reminding you to call and schedule your appointment. If you don't receive this letter, please contact our office.  Any Other Special Instructions Will Be Listed Below (If Applicable).  If you need a refill on your cardiac medications before your next appointment, please call your pharmacy.

## 2017-01-26 ENCOUNTER — Telehealth: Payer: Self-pay

## 2017-01-26 MED ORDER — RIVAROXABAN 15 MG PO TABS: 15.0000 mg | ORAL_TABLET | Freq: Every day | ORAL | 3 refills | Status: DC

## 2017-01-26 NOTE — Telephone Encounter (Signed)
Pt aware of labs and that xarelto dose is decreased to 15 mg daily,escribed to pharmacy

## 2017-01-26 NOTE — Telephone Encounter (Signed)
-----   Message from Satira Sark, MD sent at 01/23/2017  3:29 PM EST ----- Results reviewed. Based on current renal function, creatinine 1.34 and creatinine clearance of 50, would reduce Xarelto to 15 mg daily. A copy of this test should be forwarded to [facility], MD.

## 2017-01-27 ENCOUNTER — Ambulatory Visit (HOSPITAL_COMMUNITY)
Admission: RE | Admit: 2017-01-27 | Discharge: 2017-01-27 | Disposition: A | Discharge status: Home / Self Care | Payer: Medicare Other | Source: Ambulatory Visit | Attending: Internal Medicine | Admitting: Internal Medicine

## 2017-01-27 DIAGNOSIS — R058 Other specified cough: Secondary | ICD-10-CM

## 2017-01-27 DIAGNOSIS — R05 Cough: Secondary | ICD-10-CM

## 2017-01-27 DIAGNOSIS — J349 Unspecified disorder of nose and nasal sinuses: Secondary | ICD-10-CM | POA: Insufficient documentation

## 2017-01-28 NOTE — Progress Notes (Signed)
Spoke with pt and notified of results per Dr. Wert. Pt verbalized understanding and denied any questions. 

## 2017-02-03 DIAGNOSIS — L821 Other seborrheic keratosis: Secondary | ICD-10-CM | POA: Diagnosis not present

## 2017-02-03 DIAGNOSIS — L570 Actinic keratosis: Secondary | ICD-10-CM | POA: Diagnosis not present

## 2017-02-03 DIAGNOSIS — D1801 Hemangioma of skin and subcutaneous tissue: Secondary | ICD-10-CM | POA: Diagnosis not present

## 2017-02-03 DIAGNOSIS — L723 Sebaceous cyst: Secondary | ICD-10-CM | POA: Diagnosis not present

## 2017-02-03 DIAGNOSIS — D692 Other nonthrombocytopenic purpura: Secondary | ICD-10-CM | POA: Diagnosis not present

## 2017-02-10 DIAGNOSIS — K1370 Unspecified lesions of oral mucosa: Secondary | ICD-10-CM | POA: Diagnosis not present

## 2017-02-12 ENCOUNTER — Ambulatory Visit (INDEPENDENT_AMBULATORY_CARE_PROVIDER_SITE_OTHER): Payer: Medicare Other | Admitting: Otolaryngology

## 2017-02-12 DIAGNOSIS — D3702 Neoplasm of uncertain behavior of tongue: Secondary | ICD-10-CM

## 2017-02-12 DIAGNOSIS — R070 Pain in throat: Secondary | ICD-10-CM

## 2017-02-19 ENCOUNTER — Ambulatory Visit (INDEPENDENT_AMBULATORY_CARE_PROVIDER_SITE_OTHER): Payer: Medicare Other | Admitting: Otolaryngology

## 2017-02-19 DIAGNOSIS — D3705 Neoplasm of uncertain behavior of pharynx: Secondary | ICD-10-CM

## 2017-02-19 DIAGNOSIS — R070 Pain in throat: Secondary | ICD-10-CM | POA: Diagnosis not present

## 2017-03-03 ENCOUNTER — Other Ambulatory Visit (HOSPITAL_COMMUNITY): Payer: Self-pay | Admitting: Internal Medicine

## 2017-03-03 DIAGNOSIS — R1310 Dysphagia, unspecified: Secondary | ICD-10-CM | POA: Diagnosis not present

## 2017-03-03 DIAGNOSIS — R634 Abnormal weight loss: Secondary | ICD-10-CM

## 2017-03-03 DIAGNOSIS — J449 Chronic obstructive pulmonary disease, unspecified: Secondary | ICD-10-CM | POA: Diagnosis not present

## 2017-03-03 DIAGNOSIS — Z79899 Other long term (current) drug therapy: Secondary | ICD-10-CM | POA: Diagnosis not present

## 2017-03-09 ENCOUNTER — Ambulatory Visit (HOSPITAL_COMMUNITY)
Admission: RE | Admit: 2017-03-09 | Discharge: 2017-03-09 | Disposition: A | Discharge status: Home / Self Care | Payer: Medicare Other | Source: Ambulatory Visit | Attending: Internal Medicine | Admitting: Internal Medicine

## 2017-03-09 DIAGNOSIS — K449 Diaphragmatic hernia without obstruction or gangrene: Secondary | ICD-10-CM | POA: Diagnosis not present

## 2017-03-09 DIAGNOSIS — R1310 Dysphagia, unspecified: Secondary | ICD-10-CM | POA: Insufficient documentation

## 2017-03-09 DIAGNOSIS — K219 Gastro-esophageal reflux disease without esophagitis: Secondary | ICD-10-CM | POA: Insufficient documentation

## 2017-03-09 DIAGNOSIS — R634 Abnormal weight loss: Secondary | ICD-10-CM | POA: Insufficient documentation

## 2017-03-26 DIAGNOSIS — N3000 Acute cystitis without hematuria: Secondary | ICD-10-CM | POA: Diagnosis not present

## 2017-03-27 DIAGNOSIS — N3000 Acute cystitis without hematuria: Secondary | ICD-10-CM | POA: Diagnosis not present

## 2017-04-23 DIAGNOSIS — J449 Chronic obstructive pulmonary disease, unspecified: Secondary | ICD-10-CM | POA: Diagnosis not present

## 2017-04-23 DIAGNOSIS — I480 Paroxysmal atrial fibrillation: Secondary | ICD-10-CM | POA: Diagnosis not present

## 2017-05-13 DIAGNOSIS — I10 Essential (primary) hypertension: Secondary | ICD-10-CM | POA: Diagnosis not present

## 2017-05-13 DIAGNOSIS — I480 Paroxysmal atrial fibrillation: Secondary | ICD-10-CM | POA: Diagnosis not present

## 2017-05-13 DIAGNOSIS — J449 Chronic obstructive pulmonary disease, unspecified: Secondary | ICD-10-CM | POA: Diagnosis not present

## 2017-05-13 DIAGNOSIS — Z79899 Other long term (current) drug therapy: Secondary | ICD-10-CM | POA: Diagnosis not present

## 2017-05-13 DIAGNOSIS — Z125 Encounter for screening for malignant neoplasm of prostate: Secondary | ICD-10-CM | POA: Diagnosis not present

## 2017-05-22 DIAGNOSIS — I10 Essential (primary) hypertension: Secondary | ICD-10-CM | POA: Diagnosis not present

## 2017-05-22 DIAGNOSIS — I480 Paroxysmal atrial fibrillation: Secondary | ICD-10-CM | POA: Diagnosis not present

## 2017-05-22 DIAGNOSIS — J449 Chronic obstructive pulmonary disease, unspecified: Secondary | ICD-10-CM | POA: Diagnosis not present

## 2017-05-22 DIAGNOSIS — Z6824 Body mass index (BMI) 24.0-24.9, adult: Secondary | ICD-10-CM | POA: Diagnosis not present

## 2017-05-26 ENCOUNTER — Other Ambulatory Visit: Payer: Self-pay | Admitting: Cardiology

## 2017-06-15 ENCOUNTER — Ambulatory Visit (INDEPENDENT_AMBULATORY_CARE_PROVIDER_SITE_OTHER): Payer: Medicare Other | Admitting: Cardiology

## 2017-06-15 ENCOUNTER — Encounter: Payer: Self-pay | Admitting: Cardiology

## 2017-06-15 VITALS — BP 128/76 | HR 65 | Ht 72.0 in | Wt 163.8 lb

## 2017-06-15 DIAGNOSIS — I10 Essential (primary) hypertension: Secondary | ICD-10-CM

## 2017-06-15 DIAGNOSIS — I480 Paroxysmal atrial fibrillation: Secondary | ICD-10-CM
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
## 2017-06-15 NOTE — Patient Instructions (Signed)
Your physician wants you to follow-up in:  6 months with Dr McDowell You will receive a reminder letter in the mail two months in advance. If you don't receive a letter, please call our office to schedule the follow-up appointment.    Your physician recommends that you continue on your current medications as directed. Please refer to the Current Medication list given to you today.     If you need a refill on your cardiac medications before your next appointment, please call your pharmacy.     No lab work or testing ordered today.      Thank you for choosing [facility] !        

## 2017-06-22 ENCOUNTER — Ambulatory Visit (INDEPENDENT_AMBULATORY_CARE_PROVIDER_SITE_OTHER): Payer: Medicare Other | Admitting: Otolaryngology

## 2017-06-22 DIAGNOSIS — R070 Pain in throat: Secondary | ICD-10-CM | POA: Diagnosis not present

## 2017-06-22 DIAGNOSIS — D380 Neoplasm of uncertain behavior of larynx: Secondary | ICD-10-CM

## 2017-09-22 DIAGNOSIS — L821 Other seborrheic keratosis: Secondary | ICD-10-CM | POA: Diagnosis not present

## 2017-09-22 DIAGNOSIS — L820 Inflamed seborrheic keratosis: Secondary | ICD-10-CM | POA: Diagnosis not present

## 2017-09-28 DIAGNOSIS — I480 Paroxysmal atrial fibrillation: Secondary | ICD-10-CM | POA: Diagnosis not present

## 2017-09-28 DIAGNOSIS — I10 Essential (primary) hypertension: Secondary | ICD-10-CM | POA: Diagnosis not present

## 2017-09-28 DIAGNOSIS — J449 Chronic obstructive pulmonary disease, unspecified: Secondary | ICD-10-CM | POA: Diagnosis not present

## 2017-10-09 ENCOUNTER — Emergency Department (HOSPITAL_COMMUNITY)
Admission: EM | Admit: 2017-10-09 | Discharge: 2017-10-10 | Disposition: A | Discharge status: Home / Self Care | Payer: Medicare Other | Attending: Emergency Medicine | Admitting: Emergency Medicine

## 2017-10-09 ENCOUNTER — Emergency Department (HOSPITAL_COMMUNITY): Payer: Medicare Other

## 2017-10-09 ENCOUNTER — Encounter (HOSPITAL_COMMUNITY): Payer: Self-pay | Admitting: *Deleted

## 2017-10-09 DIAGNOSIS — C61 Malignant neoplasm of prostate: Secondary | ICD-10-CM | POA: Insufficient documentation

## 2017-10-09 DIAGNOSIS — I10 Essential (primary) hypertension: Secondary | ICD-10-CM | POA: Insufficient documentation

## 2017-10-09 DIAGNOSIS — Z79899 Other long term (current) drug therapy: Secondary | ICD-10-CM | POA: Insufficient documentation

## 2017-10-09 DIAGNOSIS — J181 Lobar pneumonia, unspecified organism: Secondary | ICD-10-CM | POA: Insufficient documentation

## 2017-10-09 DIAGNOSIS — R079 Chest pain, unspecified: Secondary | ICD-10-CM | POA: Diagnosis not present

## 2017-10-09 DIAGNOSIS — Z7901 Long term (current) use of anticoagulants: Secondary | ICD-10-CM | POA: Diagnosis not present

## 2017-10-09 DIAGNOSIS — Z87891 Personal history of nicotine dependence: Secondary | ICD-10-CM | POA: Insufficient documentation

## 2017-10-09 DIAGNOSIS — J189 Pneumonia, unspecified organism: Secondary | ICD-10-CM | POA: Diagnosis not present

## 2017-10-09 LAB — COMPREHENSIVE METABOLIC PANEL
ALT: 20 U/L (ref 17–63)
ANION GAP: 18 — AB (ref 5–15)
AST: 20 U/L (ref 15–41)
Albumin: 4 g/dL (ref 3.5–5.0)
Alkaline Phosphatase: 78 U/L (ref 38–126)
BILIRUBIN TOTAL: 1.6 mg/dL — AB (ref 0.3–1.2)
BUN: 20 mg/dL (ref 6–20)
CHLORIDE: 98 mmol/L — AB (ref 101–111)
CO2: 26 mmol/L (ref 22–32)
Calcium: 10.3 mg/dL (ref 8.9–10.3)
Creatinine, Ser: 1.15 mg/dL (ref 0.61–1.24)
GFR calc Af Amer: >60 mL/min (ref >60)
GFR, EST NON AFRICAN AMERICAN: 57 mL/min — AB (ref >60)
GLUCOSE: 133 mg/dL — AB (ref 65–99)
POTASSIUM: 3.3 mmol/L — AB (ref 3.5–5.1)
Sodium: 142 mmol/L (ref 135–145)
TOTAL PROTEIN: 6.5 g/dL (ref 6.5–8.1)

## 2017-10-09 LAB — CBC
HEMATOCRIT: 40.9 % (ref 39.0–52.0)
Hemoglobin: 13.8 g/dL (ref 13.0–17.0)
MCH: 29.2 pg (ref 26.0–34.0)
MCHC: 33.7 g/dL (ref 30.0–36.0)
MCV: 86.5 fL (ref 78.0–100.0)
PLATELETS: 185 10*3/uL (ref 150–400)
RBC: 4.73 MIL/uL (ref 4.22–5.81)
RDW: 13.5 % (ref 11.5–15.5)
WBC: 17.8 10*3/uL — ABNORMAL HIGH (ref 4.0–10.5)

## 2017-10-09 LAB — D-DIMER, QUANTITATIVE (NOT AT ARMC): D DIMER QUANT: 0.33 ug{FEU}/mL (ref 0.00–0.50)

## 2017-10-09 LAB — I-STAT TROPONIN, ED: TROPONIN I, POC: 0 ng/mL (ref 0.00–0.08)

## 2017-10-09 MED ORDER — AZITHROMYCIN 250 MG PO TABS
500.0000 mg | ORAL_TABLET | Freq: Once | ORAL | Status: AC
  Administered 2017-10-09: 500 mg via ORAL
  Filled 2017-10-09: qty 2

## 2017-10-09 MED ORDER — DEXTROSE 5 % IV SOLN
1.0000 g | Freq: Once | INTRAVENOUS | Status: AC
  Administered 2017-10-09: 1 g via INTRAVENOUS
  Filled 2017-10-09: qty 10

## 2017-10-09 MED ORDER — ACETAMINOPHEN 325 MG PO TABS
650.0000 mg | ORAL_TABLET | Freq: Once | ORAL | Status: AC
  Administered 2017-10-09: 650 mg via ORAL
  Filled 2017-10-09: qty 2

## 2017-10-09 MED ORDER — ASPIRIN 81 MG PO CHEW
324.0000 mg | CHEWABLE_TABLET | Freq: Once | ORAL | Status: AC
  Administered 2017-10-09: 324 mg via ORAL
  Filled 2017-10-09: qty 4
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
## 2017-10-09 NOTE — ED Triage Notes (Signed)
Pt reports non-radiating right sided chest pain around 4:30-5:30 pm. Wife states that the pt vomited x 1 felt dizzy and took some meclizine, and was unable to eat dinner. Pt states that he hasn't felt well all day. Pt reports a 100.1 temp at home.

## 2017-10-09 NOTE — ED Triage Notes (Signed)
Pt reports history of CP since 1700  Dr Willey Blade PCP

## 2017-10-10 MED ORDER — AZITHROMYCIN 250 MG PO TABS: 250.0000 mg | ORAL_TABLET | Freq: Every day | ORAL | 0 refills | Status: DC

## 2017-10-10 MED ORDER — AMOXICILLIN 500 MG PO CAPS: 500.0000 mg | ORAL_CAPSULE | Freq: Three times a day (TID) | ORAL | 0 refills | Status: DC

## 2017-10-10 NOTE — Discharge Instructions (Signed)
Take the antibiotics as prescribed, follow up with your doctor later this week to be rechecked.  Return to the ED for fever, worsening symptoms

## 2017-10-12 ENCOUNTER — Other Ambulatory Visit (HOSPITAL_COMMUNITY): Payer: Self-pay | Admitting: Internal Medicine

## 2017-10-12 DIAGNOSIS — R319 Hematuria, unspecified: Secondary | ICD-10-CM | POA: Diagnosis not present

## 2017-10-12 DIAGNOSIS — R310 Gross hematuria: Secondary | ICD-10-CM

## 2017-10-12 DIAGNOSIS — I480 Paroxysmal atrial fibrillation: Secondary | ICD-10-CM | POA: Diagnosis not present

## 2017-10-13 ENCOUNTER — Ambulatory Visit (HOSPITAL_COMMUNITY)
Admission: RE | Admit: 2017-10-13 | Discharge: 2017-10-13 | Disposition: A | Discharge status: Home / Self Care | Payer: Medicare Other | Source: Ambulatory Visit | Attending: Internal Medicine | Admitting: Internal Medicine

## 2017-10-13 DIAGNOSIS — N281 Cyst of kidney, acquired: Secondary | ICD-10-CM | POA: Diagnosis not present

## 2017-10-13 DIAGNOSIS — R188 Other ascites: Secondary | ICD-10-CM | POA: Diagnosis not present

## 2017-10-13 DIAGNOSIS — R310 Gross hematuria: Secondary | ICD-10-CM | POA: Diagnosis not present

## 2017-10-13 MED ORDER — IOPAMIDOL (ISOVUE-300) INJECTION 61%
125.0000 mL | Freq: Once | INTRAVENOUS | Status: AC | PRN
  Administered 2017-10-13: 125 mL via INTRAVENOUS

## 2017-10-23 ENCOUNTER — Other Ambulatory Visit: Payer: Self-pay

## 2017-10-23 MED ORDER — RIVAROXABAN 15 MG PO TABS: 15.0000 mg | ORAL_TABLET | Freq: Every day | ORAL | 3 refills | Status: DC

## 2017-10-23 NOTE — Telephone Encounter (Signed)
Per fax request refilled xarelto 15 mg qd

## 2017-11-05 DIAGNOSIS — C61 Malignant neoplasm of prostate: Secondary | ICD-10-CM | POA: Diagnosis not present

## 2017-11-05 DIAGNOSIS — N323 Diverticulum of bladder: Secondary | ICD-10-CM | POA: Diagnosis not present

## 2017-11-05 DIAGNOSIS — R310 Gross hematuria: Secondary | ICD-10-CM | POA: Diagnosis not present

## 2017-11-05 DIAGNOSIS — N402 Nodular prostate without lower urinary tract symptoms: Secondary | ICD-10-CM | POA: Diagnosis not present

## 2017-11-27 ENCOUNTER — Telehealth: Payer: Self-pay | Admitting: Cardiology

## 2017-11-27 NOTE — Telephone Encounter (Signed)
Continue to monitor this. Would like to keep him on Xarelto if he has no further or frank bleeding. If urologist did not find abnormality, will do cautious waiting for now.

## 2017-11-27 NOTE — Telephone Encounter (Signed)
Pt states that he has blood in urine with urination for the last two weeks. Pt has been seen by PCP and urologist. Pt states that Dr. Willey Blade gave him Zithromax for 5 days and after this he did not see the blood for a few days. Pt and wife state patient is able to see blood on some days not on others. Wife states that Dr. Jeffie Pollock reported seeing blood under the microscope during their visit. The pt states that he still has blood in his urine daily. States that urine color is bright red at times and pale at others. Pt denies pain at this time.  Will request latest lab from PCP. Please advise.

## 2017-11-27 NOTE — Telephone Encounter (Signed)
I will foprward to Dr. Purcell Nails and Juluis Rainier Dr.McDowell

## 2017-11-27 NOTE — Telephone Encounter (Signed)
Pt came into office stating he's had some blood in his urine, went to urologist and he said that everything checked out ok and his urologist wanted him to check about his Rivaroxaban (XARELTO) 15 MG TABS tablet [[id number]]

## 2017-11-29 NOTE — Telephone Encounter (Signed)
I would say that if he is still seeing regular blood in his rine, he should check back with his urologist. Does he need workup for kidney stones or other bladder pathology? We could hold Xarelto temporarily if needed, but this might not resolve the problem if he still has an underlying problem that is contributing to the bleeding. Agree with getting recent lab work.

## 2017-12-02 ENCOUNTER — Encounter: Payer: Self-pay | Admitting: *Deleted

## 2017-12-02 NOTE — Telephone Encounter (Signed)
Spoke with pt this morning. He states that he is still having trouble with the blood in his urine. He stated that his urologist did not find any underlying reasons for this problem. I informed the pt to hold his xarelto for a few days, but to keep [place] informed of his symptoms. He stated he has a follow up appointment with his urologist on January 2nd. Labs requested from Dr. Ria Comment office.

## 2017-12-09 DIAGNOSIS — C61 Malignant neoplasm of prostate: Secondary | ICD-10-CM | POA: Diagnosis not present

## 2017-12-09 DIAGNOSIS — N401 Enlarged prostate with lower urinary tract symptoms: Secondary | ICD-10-CM | POA: Diagnosis not present

## 2017-12-09 DIAGNOSIS — R3912 Poor urinary stream: Secondary | ICD-10-CM | POA: Diagnosis not present

## 2017-12-09 DIAGNOSIS — R310 Gross hematuria: Secondary | ICD-10-CM | POA: Diagnosis not present
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
## 2017-12-23 ENCOUNTER — Encounter: Payer: Self-pay | Admitting: Cardiology

## 2017-12-23 ENCOUNTER — Ambulatory Visit (INDEPENDENT_AMBULATORY_CARE_PROVIDER_SITE_OTHER): Payer: Medicare Other | Admitting: Cardiology

## 2017-12-23 VITALS — BP 112/64 | HR 58 | Ht 72.0 in | Wt 170.0 lb

## 2017-12-23 DIAGNOSIS — I10 Essential (primary) hypertension: Secondary | ICD-10-CM | POA: Diagnosis not present

## 2017-12-23 DIAGNOSIS — I480 Paroxysmal atrial fibrillation: Secondary | ICD-10-CM | POA: Diagnosis not present

## 2017-12-23 DIAGNOSIS — K219 Gastro-esophageal reflux disease without esophagitis: Secondary | ICD-10-CM | POA: Diagnosis not present

## 2017-12-23 NOTE — Patient Instructions (Signed)

## 2018-01-23 IMAGING — DX DG CHEST 2V
2 series · 2 of 2 positions shown · non-contrast
Comparison: PA and lateral chest x-ray November 28, 2011

CLINICAL DATA: Six months of cough without clinical improvement.
History of hypertension gastroesophageal reflux. Former heavy
smoker.

EXAM:
CHEST  2 VIEW

[chest pa]
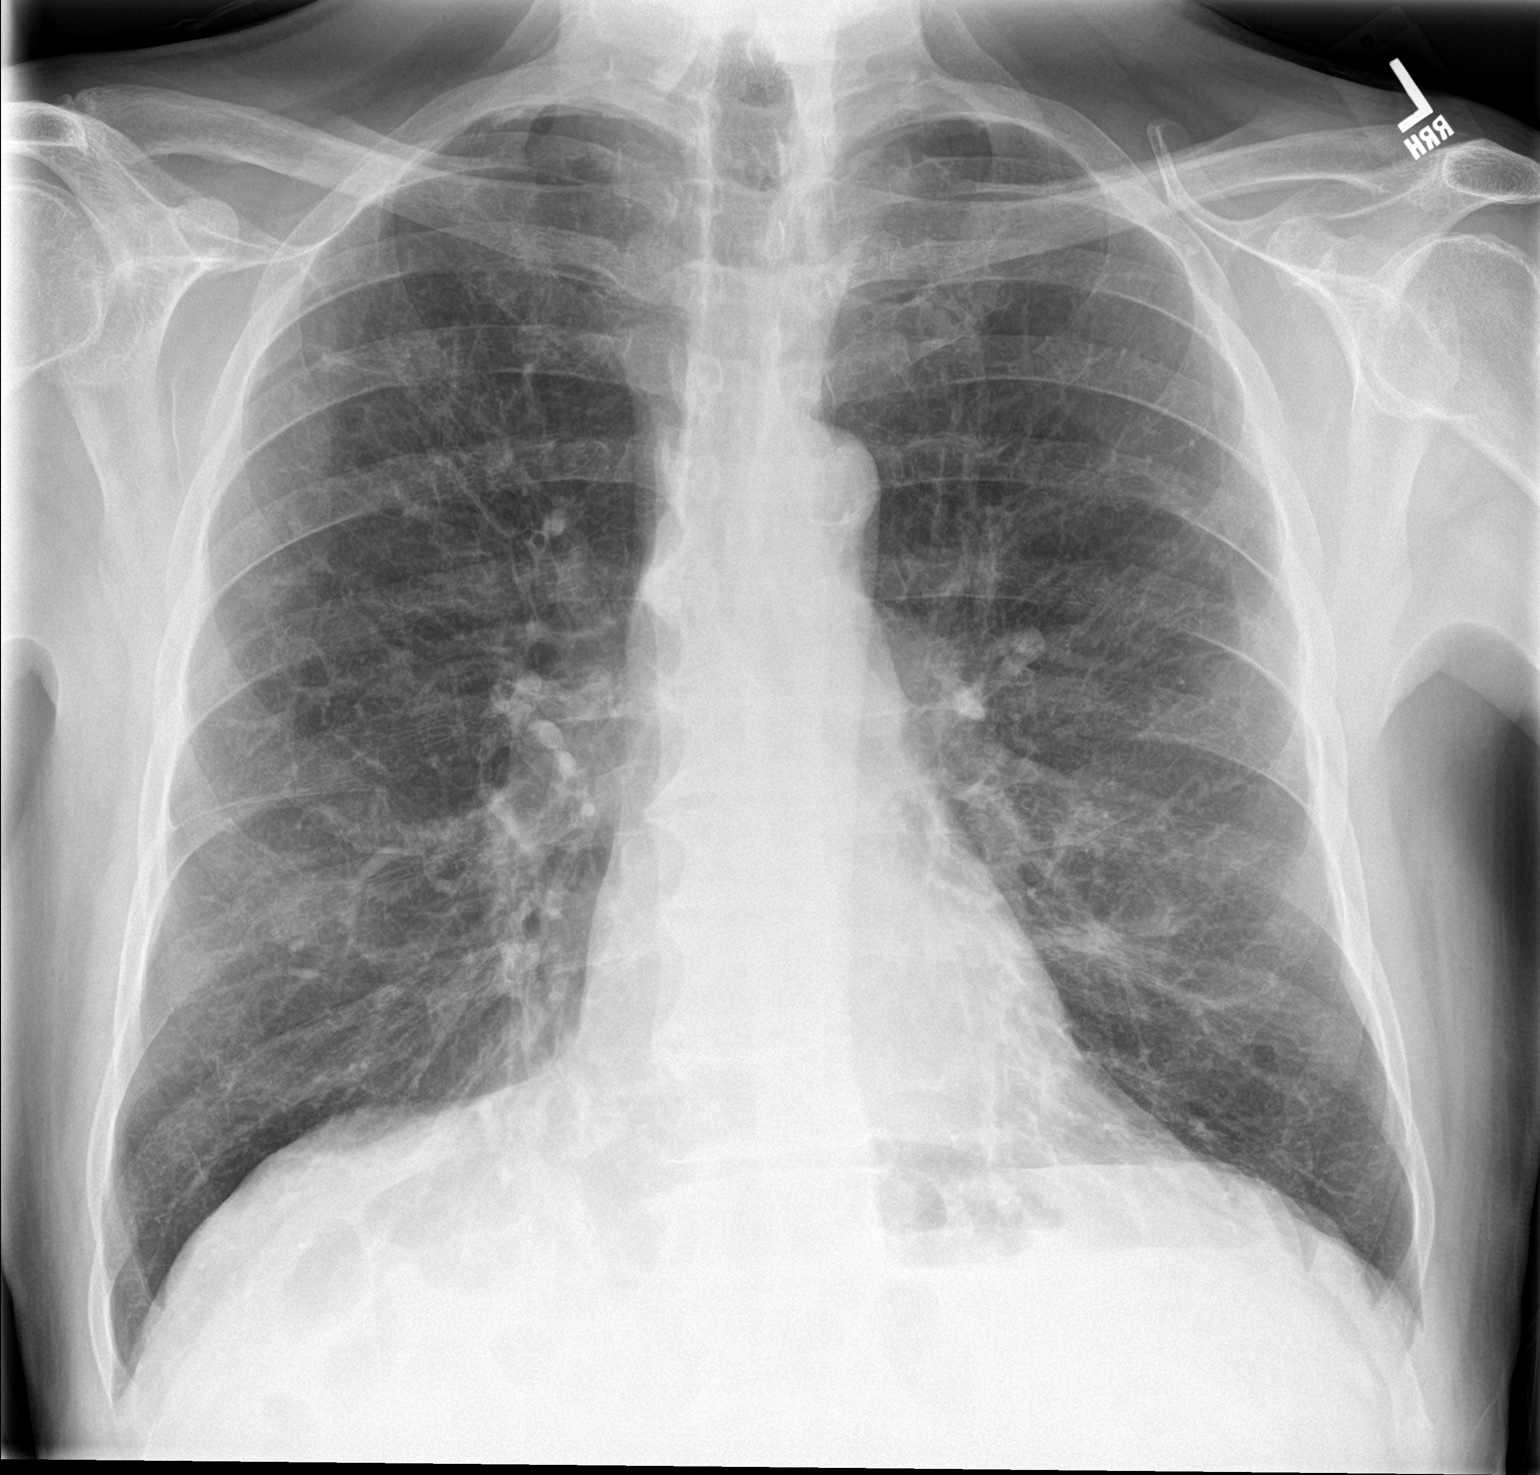

[chest lat]
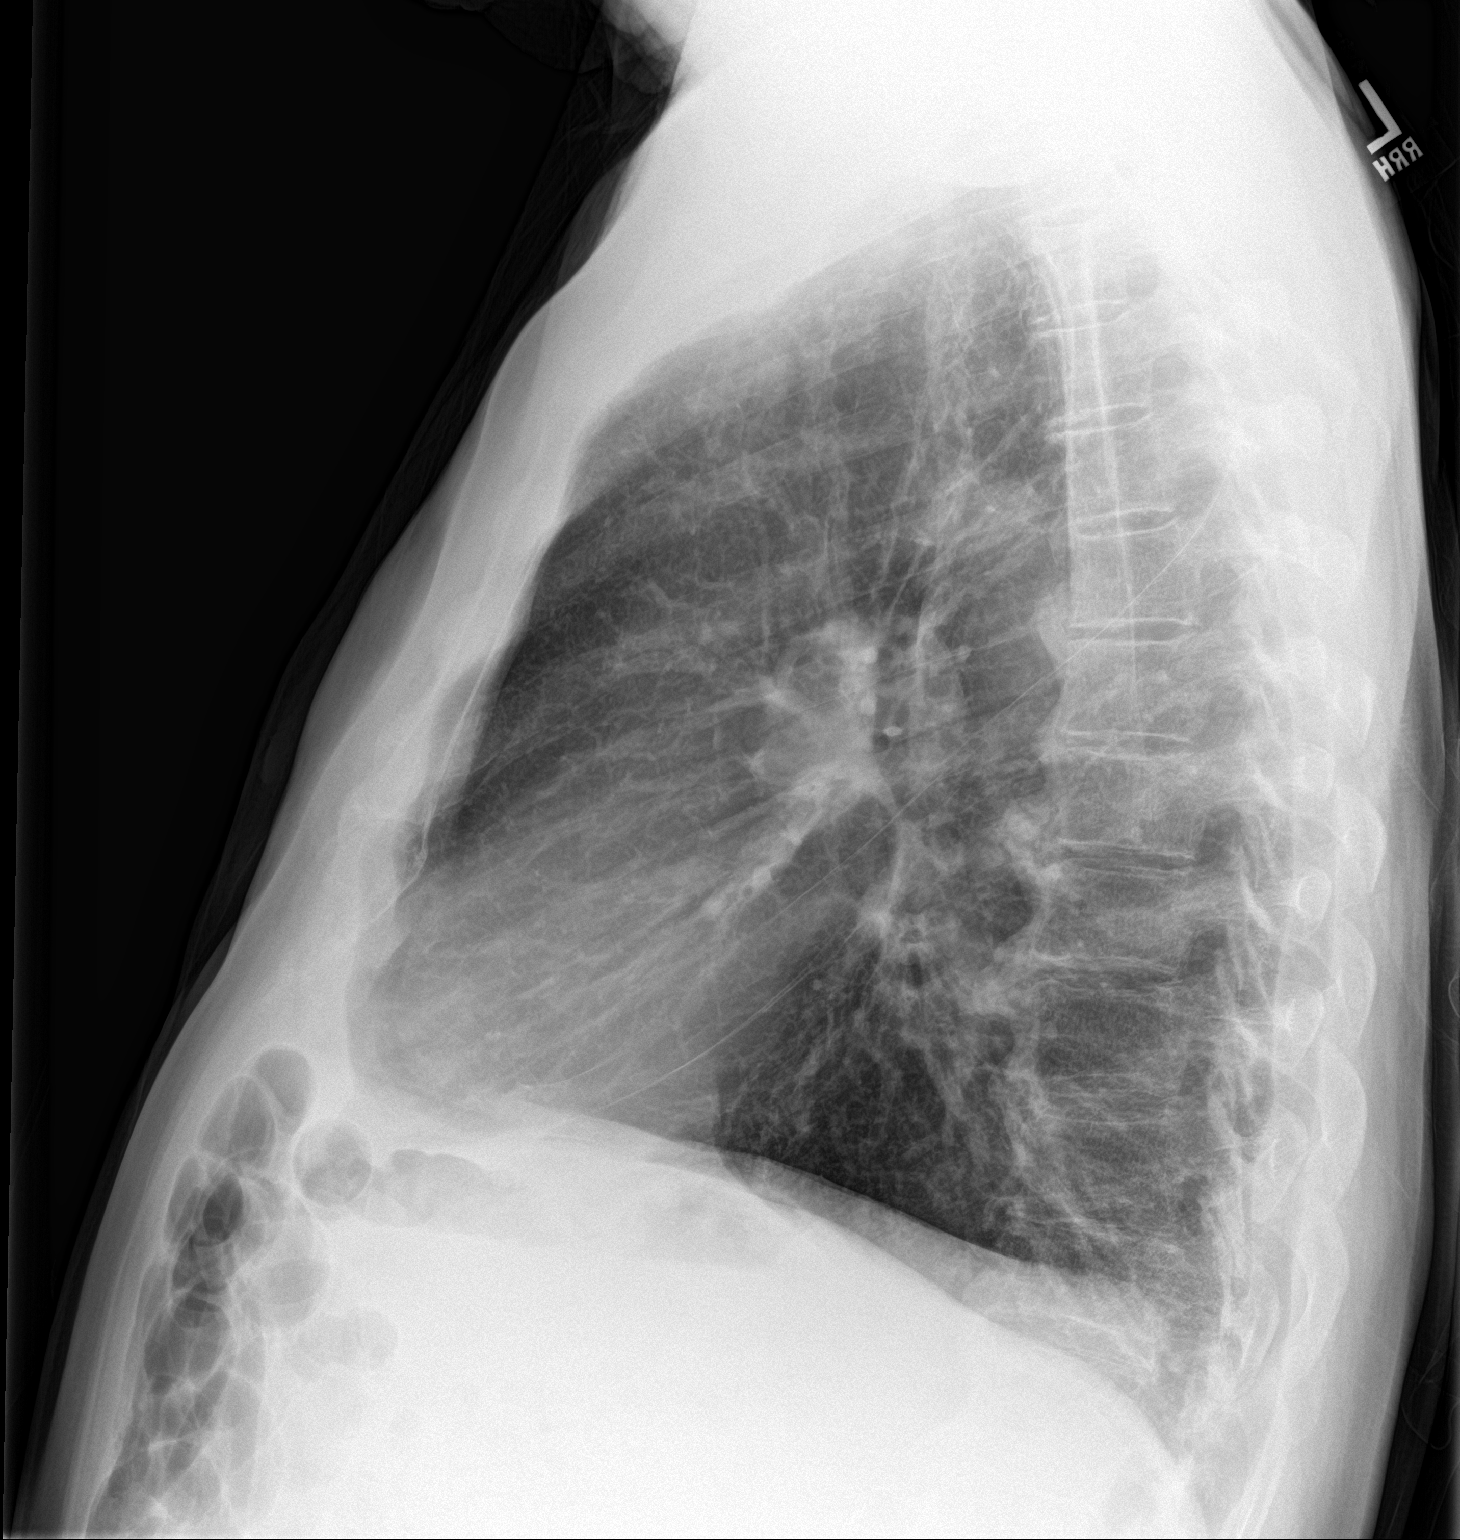

[2 of 2 positions shown; findings below may reference images not displayed]

FINDINGS: The lungs are mildly hyperinflated. The interstitial markings are
chronically increased but are overall slightly more conspicuous than
on the previous study. There is focal increased interstitial density
in the left lower lobe as compared to the previous study. There is
no alveolar infiltrate or pleural effusion. The heart and pulmonary
vascularity are normal. There is calcification within the wall of
the aortic arch. There is calcification of portions of the anterior
longitudinal ligament of the thoracic spine.
IMPRESSION: COPD. Increased lung markings over baseline but greatest in the left
lower lobe. This may reflect interstitial pneumonia. Followup PA and
lateral chest X-ray is recommended in 3-4 weeks following trial of
antibiotic therapy to ensure resolution and exclude underlying
malignancy.

No CHF.  Aortic atherosclerosis.

## 2018-01-27 DIAGNOSIS — I700 Atherosclerosis of aorta: Secondary | ICD-10-CM | POA: Diagnosis not present

## 2018-01-27 DIAGNOSIS — Z6824 Body mass index (BMI) 24.0-24.9, adult: Secondary | ICD-10-CM | POA: Diagnosis not present

## 2018-01-27 DIAGNOSIS — I10 Essential (primary) hypertension: Secondary | ICD-10-CM | POA: Diagnosis not present

## 2018-01-27 DIAGNOSIS — R319 Hematuria, unspecified: Secondary | ICD-10-CM | POA: Diagnosis not present

## 2018-05-20 DIAGNOSIS — I10 Essential (primary) hypertension: Secondary | ICD-10-CM | POA: Diagnosis not present

## 2018-05-20 DIAGNOSIS — N183 Chronic kidney disease, stage 3 (moderate): Secondary | ICD-10-CM | POA: Diagnosis not present

## 2018-05-20 DIAGNOSIS — Z79899 Other long term (current) drug therapy: Secondary | ICD-10-CM | POA: Diagnosis not present

## 2018-05-20 DIAGNOSIS — Z125 Encounter for screening for malignant neoplasm of prostate: Secondary | ICD-10-CM | POA: Diagnosis not present

## 2018-05-20 DIAGNOSIS — D075 Carcinoma in situ of prostate: Secondary | ICD-10-CM | POA: Diagnosis not present

## 2018-05-20 DIAGNOSIS — R7301 Impaired fasting glucose: Secondary | ICD-10-CM | POA: Diagnosis not present

## 2018-05-20 DIAGNOSIS — K219 Gastro-esophageal reflux disease without esophagitis: Secondary | ICD-10-CM | POA: Diagnosis not present

## 2018-05-20 DIAGNOSIS — I480 Paroxysmal atrial fibrillation: Secondary | ICD-10-CM | POA: Diagnosis not present

## 2018-06-11 DIAGNOSIS — I700 Atherosclerosis of aorta: Secondary | ICD-10-CM | POA: Diagnosis not present

## 2018-06-11 DIAGNOSIS — C61 Malignant neoplasm of prostate: Secondary | ICD-10-CM | POA: Diagnosis not present

## 2018-06-11 DIAGNOSIS — I480 Paroxysmal atrial fibrillation: Secondary | ICD-10-CM | POA: Diagnosis not present

## 2018-06-11 DIAGNOSIS — Z6824 Body mass index (BMI) 24.0-24.9, adult: Secondary | ICD-10-CM | POA: Diagnosis not present

## 2018-06-11 DIAGNOSIS — I10 Essential (primary) hypertension: Secondary | ICD-10-CM | POA: Diagnosis not present

## 2018-06-16 IMAGING — CT CT PARANASAL SINUSES LIMITED
1 series · 12 of 14 positions shown, 15 images · non-contrast
Comparison: CT brain scan of 06/13/2014

CLINICAL DATA: Sneezing, cough for several months

EXAM:
CT PARANASAL SINUS LIMITED WITHOUT CONTRAST
TECHNIQUE: Non-contiguous multidetector CT images of the paranasal sinuses were
obtained in a single plane without contrast.

[Series 4: limited sinus st · axial · 0.43mm/px · z∈[+213,+323]mm · 12 of 14 slices shown, 15 images]
[im 2/14  brain]
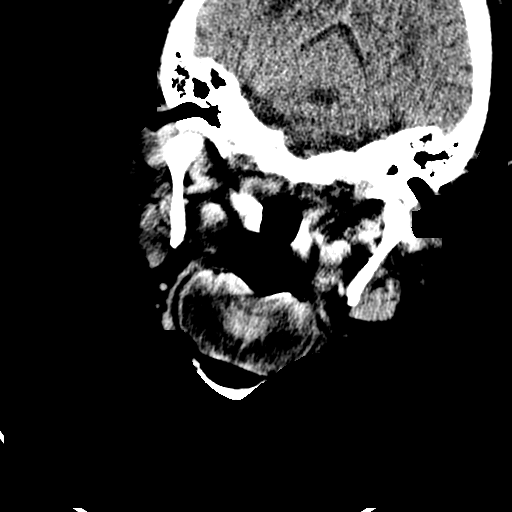
[im 2/14  bone]
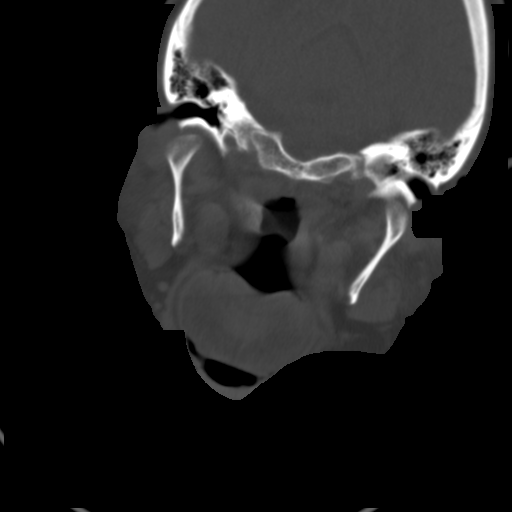
[im 3/14  bone]
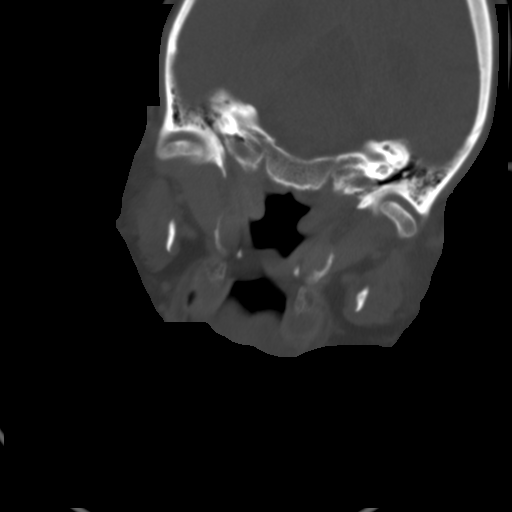
[im 4/14  bone]
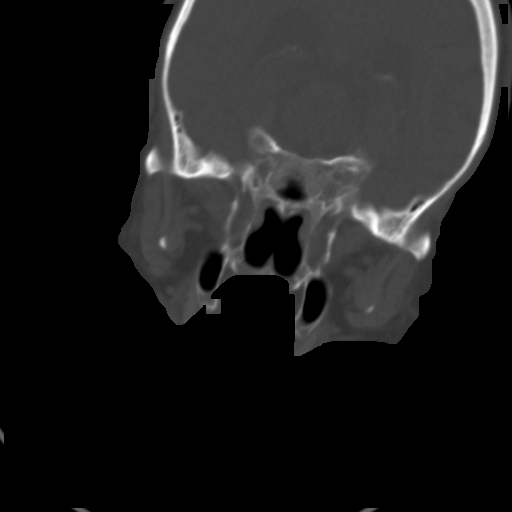
[im 5/14  bone]
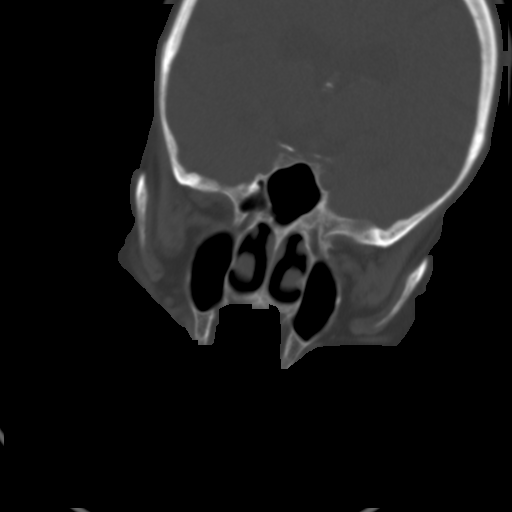
[im 6/14  brain]
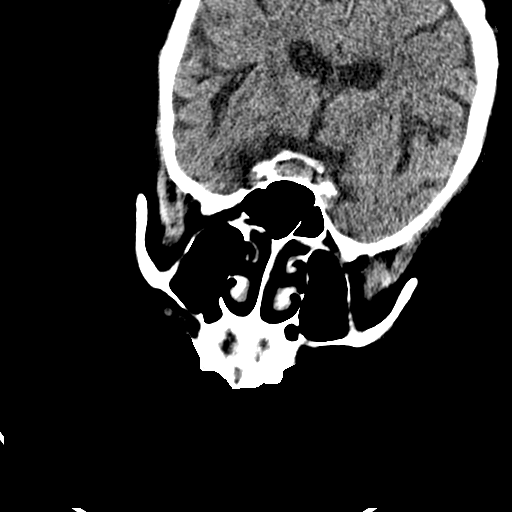
[im 6/14  bone]
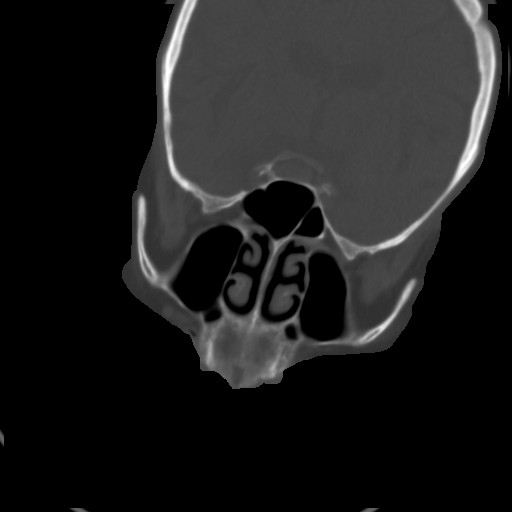
[im 7/14  bone]
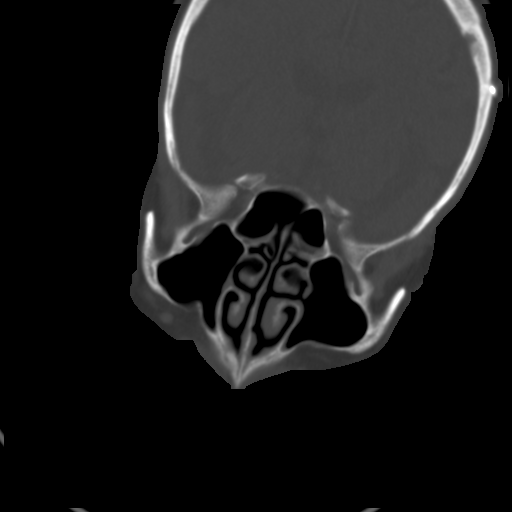
[im 8/14  bone]
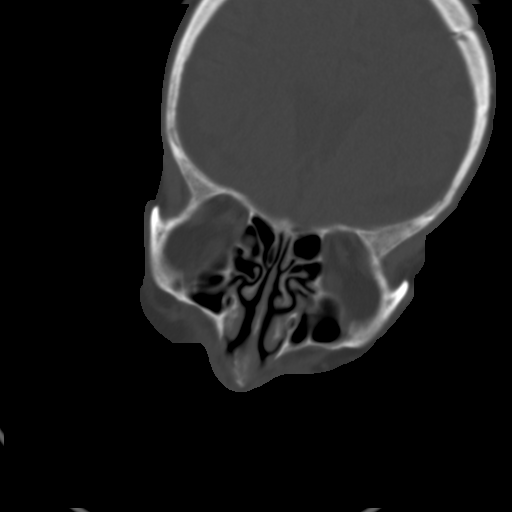
[im 9/14  bone]
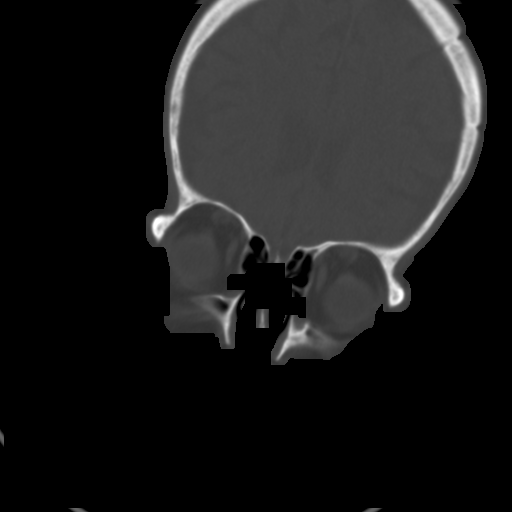
[im 10/14  brain]
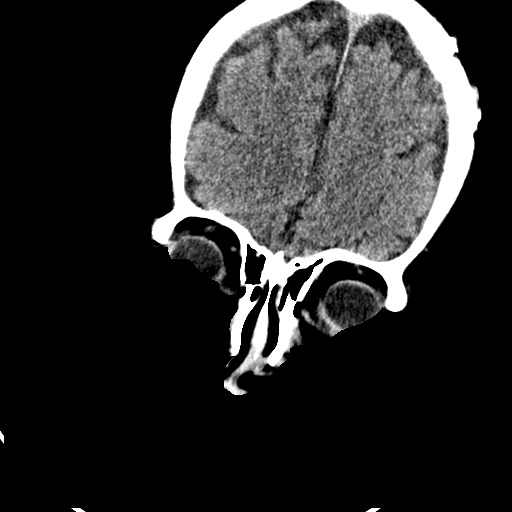
[im 10/14  bone]
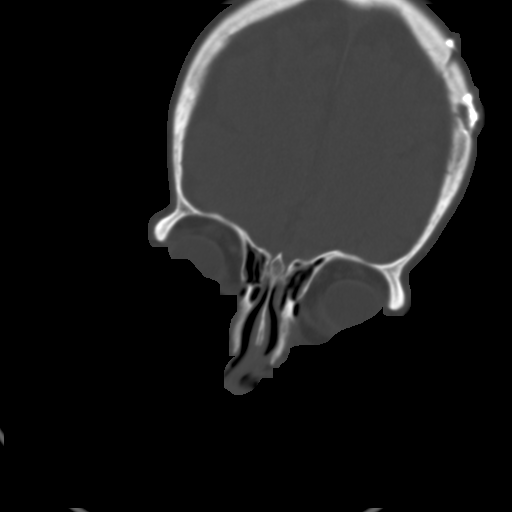
[im 11/14  bone]
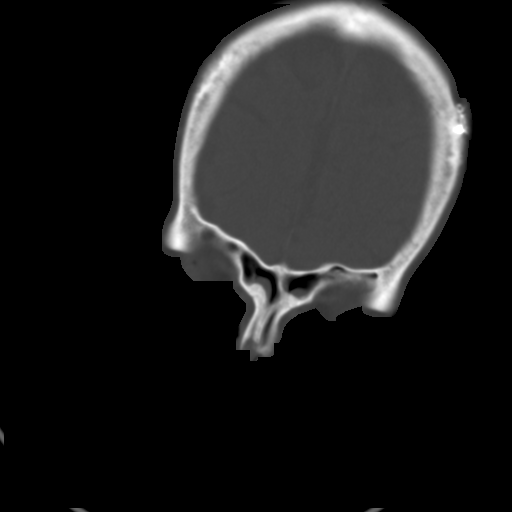
[im 12/14  bone]
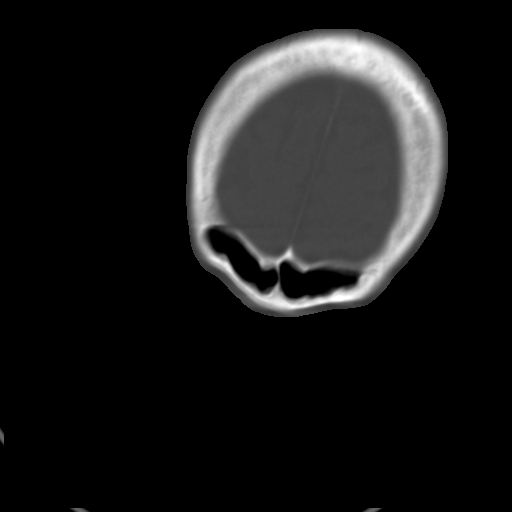
[im 13/14  bone]
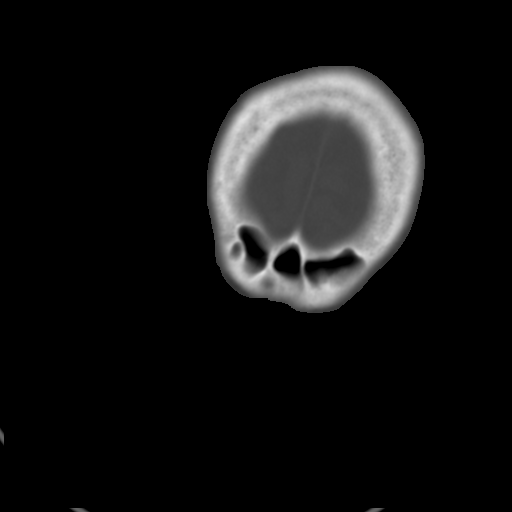

[12 of 14 positions shown; findings below may reference images not displayed]

FINDINGS: There is mild mucosal thickening in the frontal sinuses bilaterally.
However the remainder of the paranasal sinuses are well pneumatized.
No mucosal edema or fluid is seen. The nasal turbinates are normal
in size and position and the nasal airway is patent. Old right
craniotomy site is noted. No soft tissue abnormality is seen.
IMPRESSION: Mild mucosal thickening in the frontal sinuses. The remainder of the
sinuses appear well pneumatized.
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
## 2018-06-21 ENCOUNTER — Ambulatory Visit (INDEPENDENT_AMBULATORY_CARE_PROVIDER_SITE_OTHER): Payer: Medicare Other | Admitting: Cardiology

## 2018-06-21 ENCOUNTER — Encounter: Payer: Self-pay | Admitting: Cardiology

## 2018-06-21 VITALS — BP 130/74 | HR 58 | Ht 72.0 in | Wt 164.0 lb

## 2018-06-21 DIAGNOSIS — I10 Essential (primary) hypertension: Secondary | ICD-10-CM

## 2018-06-21 DIAGNOSIS — I480 Paroxysmal atrial fibrillation: Secondary | ICD-10-CM

## 2018-06-21 NOTE — Patient Instructions (Signed)
Medication Instructions:  Your physician recommends that you continue on your current medications as directed. Please refer to the Current Medication list given to you today.   Labwork: NONE   Testing/Procedures: NONE   Follow-Up: Your physician wants you to follow-up in: 6 MONTHS. You will receive a reminder letter in the mail two months in advance. If you don't receive a letter, please call our office to schedule the follow-up appointment.   Any Other Special Instructions Will Be Listed Below (If Applicable).     If you need a refill on your cardiac medications before your next appointment, please call your pharmacy. Thank you for choosing [facility]!    

## 2018-07-27 ENCOUNTER — Other Ambulatory Visit: Payer: Self-pay

## 2018-07-27 ENCOUNTER — Encounter (HOSPITAL_COMMUNITY): Payer: Self-pay

## 2018-07-27 ENCOUNTER — Emergency Department (HOSPITAL_COMMUNITY): Payer: Medicare Other

## 2018-07-27 ENCOUNTER — Emergency Department (HOSPITAL_COMMUNITY)
Admission: EM | Admit: 2018-07-27 | Discharge: 2018-07-27 | Disposition: A | Discharge status: Home / Self Care | Payer: Medicare Other | Attending: Emergency Medicine | Admitting: Emergency Medicine

## 2018-07-27 DIAGNOSIS — S0990XA Unspecified injury of head, initial encounter: Secondary | ICD-10-CM

## 2018-07-27 DIAGNOSIS — I10 Essential (primary) hypertension: Secondary | ICD-10-CM | POA: Insufficient documentation

## 2018-07-27 DIAGNOSIS — Z7982 Long term (current) use of aspirin: Secondary | ICD-10-CM | POA: Insufficient documentation

## 2018-07-27 DIAGNOSIS — Y998 Other external cause status: Secondary | ICD-10-CM | POA: Diagnosis not present

## 2018-07-27 DIAGNOSIS — S139XXA Sprain of joints and ligaments of unspecified parts of neck, initial encounter: Secondary | ICD-10-CM

## 2018-07-27 DIAGNOSIS — Z8546 Personal history of malignant neoplasm of prostate: Secondary | ICD-10-CM | POA: Diagnosis not present

## 2018-07-27 DIAGNOSIS — Z79899 Other long term (current) drug therapy: Secondary | ICD-10-CM | POA: Insufficient documentation

## 2018-07-27 DIAGNOSIS — M542 Cervicalgia: Secondary | ICD-10-CM | POA: Diagnosis not present

## 2018-07-27 DIAGNOSIS — Y9389 Activity, other specified: Secondary | ICD-10-CM | POA: Diagnosis not present

## 2018-07-27 DIAGNOSIS — Z87891 Personal history of nicotine dependence: Secondary | ICD-10-CM | POA: Diagnosis not present

## 2018-07-27 DIAGNOSIS — W1789XA Other fall from one level to another, initial encounter: Secondary | ICD-10-CM | POA: Diagnosis not present

## 2018-07-27 DIAGNOSIS — Y929 Unspecified place or not applicable: Secondary | ICD-10-CM | POA: Diagnosis not present

## 2018-07-27 DIAGNOSIS — S0101XA Laceration without foreign body of scalp, initial encounter: Secondary | ICD-10-CM | POA: Diagnosis not present

## 2018-07-27 DIAGNOSIS — S134XXA Sprain of ligaments of cervical spine, initial encounter: Secondary | ICD-10-CM | POA: Diagnosis not present

## 2018-07-27 DIAGNOSIS — S0003XA Contusion of scalp, initial encounter: Secondary | ICD-10-CM | POA: Diagnosis not present

## 2018-07-27 DIAGNOSIS — W19XXXA Unspecified fall, initial encounter: Secondary | ICD-10-CM

## 2018-07-27 DIAGNOSIS — S199XXA Unspecified injury of neck, initial encounter: Secondary | ICD-10-CM | POA: Diagnosis not present

## 2018-07-27 MED ORDER — LIDOCAINE HCL (PF) 1 % IJ SOLN
5.0000 mL | Freq: Once | INTRAMUSCULAR | Status: AC
  Administered 2018-07-27: 5 mL
  Filled 2018-07-27: qty 6

## 2018-07-27 NOTE — ED Triage Notes (Signed)
Pt tripped and fell off a truck an hour ago. Has laceration to the back of head. Is on blood thinners, but cannot remember the name of it. Is alert and oriented. NAD.
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
## 2018-07-27 NOTE — Discharge Instructions (Addendum)
You were evaluated in the emergency department for a fall in which she struck your head.  You had a laceration on the back of your head that was stapled closed and will need staple removal in 1 week.  You had a CAT scan of your head and neck that did not show any obvious fracture or bleeding.  You should rest and take Tylenol for pain.  Please return if any worsening symptoms.

## 2018-07-27 NOTE — ED Notes (Signed)
Laceration noted to back of head. C collar placed on pt

## 2018-07-27 NOTE — ED Notes (Signed)
6 staples placed by Dr Melina Copa. Pressure drsg applied to head

## 2018-07-30 ENCOUNTER — Ambulatory Visit (INDEPENDENT_AMBULATORY_CARE_PROVIDER_SITE_OTHER): Payer: Medicare Other | Admitting: Urology

## 2018-07-30 DIAGNOSIS — N401 Enlarged prostate with lower urinary tract symptoms: Secondary | ICD-10-CM | POA: Diagnosis not present

## 2018-07-30 DIAGNOSIS — Z8546 Personal history of malignant neoplasm of prostate: Secondary | ICD-10-CM

## 2018-07-30 DIAGNOSIS — R3912 Poor urinary stream: Secondary | ICD-10-CM

## 2018-08-03 DIAGNOSIS — S0101XA Laceration without foreign body of scalp, initial encounter: Secondary | ICD-10-CM | POA: Diagnosis not present

## 2018-08-03 DIAGNOSIS — R001 Bradycardia, unspecified: Secondary | ICD-10-CM | POA: Diagnosis not present

## 2018-09-07 DIAGNOSIS — Z23 Encounter for immunization: Secondary | ICD-10-CM | POA: Diagnosis not present

## 2018-10-18 DIAGNOSIS — R296 Repeated falls: Secondary | ICD-10-CM | POA: Diagnosis not present

## 2018-12-12 ENCOUNTER — Emergency Department (HOSPITAL_COMMUNITY)
Admission: EM | Admit: 2018-12-12 | Discharge: 2018-12-12 | Disposition: A | Discharge status: Home / Self Care | Payer: Medicare Other | Attending: Emergency Medicine | Admitting: Emergency Medicine

## 2018-12-12 ENCOUNTER — Emergency Department (HOSPITAL_COMMUNITY): Payer: Medicare Other

## 2018-12-12 ENCOUNTER — Other Ambulatory Visit: Payer: Self-pay

## 2018-12-12 ENCOUNTER — Encounter (HOSPITAL_COMMUNITY): Payer: Self-pay | Admitting: Emergency Medicine

## 2018-12-12 DIAGNOSIS — S40022A Contusion of left upper arm, initial encounter: Secondary | ICD-10-CM | POA: Insufficient documentation

## 2018-12-12 DIAGNOSIS — I480 Paroxysmal atrial fibrillation: Secondary | ICD-10-CM | POA: Diagnosis not present

## 2018-12-12 DIAGNOSIS — Z79899 Other long term (current) drug therapy: Secondary | ICD-10-CM | POA: Diagnosis not present

## 2018-12-12 DIAGNOSIS — Y929 Unspecified place or not applicable: Secondary | ICD-10-CM | POA: Insufficient documentation

## 2018-12-12 DIAGNOSIS — Z87891 Personal history of nicotine dependence: Secondary | ICD-10-CM | POA: Diagnosis not present

## 2018-12-12 DIAGNOSIS — Y999 Unspecified external cause status: Secondary | ICD-10-CM | POA: Insufficient documentation

## 2018-12-12 DIAGNOSIS — Y9301 Activity, walking, marching and hiking: Secondary | ICD-10-CM | POA: Diagnosis not present

## 2018-12-12 DIAGNOSIS — W19XXXA Unspecified fall, initial encounter: Secondary | ICD-10-CM

## 2018-12-12 DIAGNOSIS — W010XXA Fall on same level from slipping, tripping and stumbling without subsequent striking against object, initial encounter: Secondary | ICD-10-CM | POA: Insufficient documentation

## 2018-12-12 DIAGNOSIS — Z7982 Long term (current) use of aspirin: Secondary | ICD-10-CM | POA: Insufficient documentation

## 2018-12-12 DIAGNOSIS — S4992XA Unspecified injury of left shoulder and upper arm, initial encounter: Secondary | ICD-10-CM | POA: Diagnosis not present

## 2018-12-12 NOTE — ED Triage Notes (Signed)
Pt reports walking up steps and fell hitting shoulder on ground. Pt reports unable to lift.

## 2018-12-12 NOTE — Discharge Instructions (Addendum)
X-ray was normal.  You will be sore for several days.  Tylenol or ibuprofen for pain.
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
## 2018-12-14 DIAGNOSIS — I10 Essential (primary) hypertension: Secondary | ICD-10-CM | POA: Diagnosis not present

## 2018-12-14 DIAGNOSIS — I480 Paroxysmal atrial fibrillation: Secondary | ICD-10-CM | POA: Diagnosis not present
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
## 2018-12-29 ENCOUNTER — Ambulatory Visit (INDEPENDENT_AMBULATORY_CARE_PROVIDER_SITE_OTHER): Payer: Medicare Other | Admitting: Cardiology

## 2018-12-29 ENCOUNTER — Encounter: Payer: Self-pay | Admitting: Cardiology

## 2018-12-29 VITALS — BP 124/68 | HR 61 | Ht 72.0 in | Wt 165.6 lb

## 2018-12-29 DIAGNOSIS — I10 Essential (primary) hypertension: Secondary | ICD-10-CM | POA: Diagnosis not present

## 2018-12-29 DIAGNOSIS — I480 Paroxysmal atrial fibrillation: Secondary | ICD-10-CM | POA: Diagnosis not present

## 2018-12-29 NOTE — Patient Instructions (Signed)

## 2019-06-17 DIAGNOSIS — N183 Chronic kidney disease, stage 3 (moderate): Secondary | ICD-10-CM | POA: Diagnosis not present

## 2019-06-17 DIAGNOSIS — Z125 Encounter for screening for malignant neoplasm of prostate: Secondary | ICD-10-CM | POA: Diagnosis not present

## 2019-06-17 DIAGNOSIS — I480 Paroxysmal atrial fibrillation: Secondary | ICD-10-CM | POA: Diagnosis not present

## 2019-06-17 DIAGNOSIS — Z79899 Other long term (current) drug therapy: Secondary | ICD-10-CM | POA: Diagnosis not present

## 2019-06-17 DIAGNOSIS — D075 Carcinoma in situ of prostate: Secondary | ICD-10-CM | POA: Diagnosis not present

## 2019-06-17 DIAGNOSIS — I700 Atherosclerosis of aorta: Secondary | ICD-10-CM | POA: Diagnosis not present

## 2019-06-17 DIAGNOSIS — I10 Essential (primary) hypertension: Secondary | ICD-10-CM | POA: Diagnosis not present

## 2019-06-17 DIAGNOSIS — R7303 Prediabetes: Secondary | ICD-10-CM | POA: Diagnosis not present

## 2019-06-17 DIAGNOSIS — K219 Gastro-esophageal reflux disease without esophagitis: Secondary | ICD-10-CM | POA: Diagnosis not present

## 2019-06-24 DIAGNOSIS — I480 Paroxysmal atrial fibrillation: Secondary | ICD-10-CM | POA: Diagnosis not present

## 2019-06-24 DIAGNOSIS — I700 Atherosclerosis of aorta: Secondary | ICD-10-CM | POA: Diagnosis not present

## 2019-06-24 DIAGNOSIS — N183 Chronic kidney disease, stage 3 (moderate): Secondary | ICD-10-CM | POA: Diagnosis not present

## 2019-06-24 DIAGNOSIS — I10 Essential (primary) hypertension: Secondary | ICD-10-CM | POA: Diagnosis not present

## 2019-06-24 DIAGNOSIS — C61 Malignant neoplasm of prostate: Secondary | ICD-10-CM | POA: Diagnosis not present

## 2019-06-24 DIAGNOSIS — K219 Gastro-esophageal reflux disease without esophagitis: Secondary | ICD-10-CM | POA: Diagnosis not present

## 2019-08-26 ENCOUNTER — Ambulatory Visit (INDEPENDENT_AMBULATORY_CARE_PROVIDER_SITE_OTHER): Payer: Medicare Other | Admitting: Urology

## 2019-08-26 ENCOUNTER — Other Ambulatory Visit: Payer: Self-pay

## 2019-08-26 DIAGNOSIS — N401 Enlarged prostate with lower urinary tract symptoms: Secondary | ICD-10-CM | POA: Diagnosis not present

## 2019-08-26 DIAGNOSIS — Z8546 Personal history of malignant neoplasm of prostate: Secondary | ICD-10-CM | POA: Diagnosis not present

## 2019-09-12 DIAGNOSIS — Z23 Encounter for immunization: Secondary | ICD-10-CM | POA: Diagnosis not present

## 2019-10-24 ENCOUNTER — Telehealth: Payer: Self-pay | Admitting: Cardiology

## 2019-10-24 NOTE — Telephone Encounter (Signed)
Virtual Visit Pre-Appointment Phone Call  "(Name), I am calling you today to discuss your upcoming appointment. We are currently trying to limit exposure to the virus that causes COVID-19 by seeing patients at home rather than in the office."  1. "What is the BEST phone number to call the day of the visit?" - include this in appointment notes  2. Do you have or have access to (through a family member/friend) a smartphone with video capability that we can use for your visit?" a. If yes - list this number in appt notes as cell (if different from BEST phone #) and list the appointment type as a VIDEO visit in appointment notes b. If no - list the appointment type as a PHONE visit in appointment notes  3. Confirm consent - "In the setting of the current Covid19 crisis, you are scheduled for a (phone or video) visit with your provider on (date) at (time).  Just as we do with many in-office visits, in order for you to participate in this visit, we must obtain consent.  If you'd like, I can send this to your mychart (if signed up) or email for you to review.  Otherwise, I can obtain your verbal consent now.  All virtual visits are billed to your insurance company just like a normal visit would be.  By agreeing to a virtual visit, we'd like you to understand that the technology does not allow for your provider to perform an examination, and thus may limit your provider's ability to fully assess your condition. If your provider identifies any concerns that need to be evaluated in person, we will make arrangements to do so.  Finally, though the technology is pretty good, we cannot assure that it will always work on either your or our end, and in the setting of a video visit, we may have to convert it to a phone-only visit.  In either situation, we cannot ensure that we have a secure connection.  Are you willing to proceed?" STAFF: Did the patient verbally acknowledge consent to telehealth visit? Document  YES/NO here: yes  4. Advise patient to be prepared - "Two hours prior to your appointment, go ahead and check your blood pressure, pulse, oxygen saturation, and your weight (if you have the equipment to check those) and write them all down. When your visit starts, your provider will ask you for this information. If you have an Apple Watch or Kardia device, please plan to have heart rate information ready on the day of your appointment. Please have a pen and paper handy nearby the day of the visit as well."  5. Give patient instructions for MyChart download to smartphone OR [email or web address] as below if video visit (depending on what platform provider is using)  6. Inform patient they will receive a phone call 15 minutes prior to their appointment time (may be from unknown caller ID) so they should be prepared to answer    TELEPHONE CALL NOTE  Glenn Bean has been deemed a candidate for a follow-up tele-health visit to limit community exposure during the Covid-19 pandemic. I spoke with the patient via phone to ensure availability of phone/video source, confirm preferred email & phone number, and discuss instructions and expectations.  I reminded Glenn Bean to be prepared with any vital sign and/or heart rhythm information that could potentially be obtained via home monitoring, at the time of his visit. I reminded Glenn Bean to expect a phone call prior to  his visit.  Glenn Bean 10/24/2019 3:24 PM   INSTRUCTIONS FOR DOWNLOADING THE MYCHART APP TO SMARTPHONE  - The patient must first make sure to have activated MyChart and know their login information - If Apple, go to [employer] and type in MyChart in the search bar and download the app. If Android, ask patient to go to [employer] and type in [place] in the search bar and download the app. The app is free but as with any other app downloads, their phone may require them to verify saved payment information or  Apple/Android password.  - The patient will need to then log into the app with their MyChart username and password, and select [facility] as their healthcare provider to link the account. When it is time for your visit, go to the MyChart app, find appointments, and click Begin Video Visit. Be sure to Select Allow for your device to access the Microphone and Camera for your visit. You will then be connected, and your provider will be with you shortly.  **If they have any issues connecting, or need assistance please contact MyChart service desk [phone])**  **If using a computer, in order to ensure the best quality for their visit they will need to use either of the following Internet Browsers: [employer], or Google Chrome**  IF USING DOXIMITY or DOXY.ME - The patient will receive a link just prior to their visit by text.     FULL LENGTH CONSENT FOR TELE-HEALTH VISIT   I hereby voluntarily request, consent and authorize [facility] and its employed or contracted physicians, physician assistants, nurse practitioners or other licensed health care professionals (the Practitioner), to provide me with telemedicine health care services (the Services") as deemed necessary by the treating Practitioner. I acknowledge and consent to receive the Services by the Practitioner via telemedicine. I understand that the telemedicine visit will involve communicating with the Practitioner through live audiovisual communication technology and the disclosure of certain medical information by electronic transmission. I acknowledge that I have been given the opportunity to request an in-person assessment or other available alternative prior to the telemedicine visit and am voluntarily participating in the telemedicine visit.  I understand that I have the right to withhold or withdraw my consent to the use of telemedicine in the course of my care at any time, without affecting my right to future care  or treatment, and that the Practitioner or I may terminate the telemedicine visit at any time. I understand that I have the right to inspect all information obtained and/or recorded in the course of the telemedicine visit and may receive copies of available information for a reasonable fee.  I understand that some of the potential risks of receiving the Services via telemedicine include:   Delay or interruption in medical evaluation due to technological equipment failure or disruption;  Information transmitted may not be sufficient (e.g. poor resolution of images) to allow for appropriate medical decision making by the Practitioner; and/or   In rare instances, security protocols could fail, causing a breach of personal health information.  Furthermore, I acknowledge that it is my responsibility to provide information about my medical history, conditions and care that is complete and accurate to the best of my ability. I acknowledge that Practitioner's advice, recommendations, and/or decision may be based on factors not within their control, such as incomplete or inaccurate data provided by me or distortions of diagnostic images or specimens that may result from electronic transmissions. I  understand that the practice of medicine is not an exact science and that Practitioner makes no warranties or guarantees regarding treatment outcomes. I acknowledge that I will receive a copy of this consent concurrently upon execution via email to the email address I last provided but may also request a printed copy by calling the office of [facility].    I understand that my insurance will be billed for this visit.   I have read or had this consent read to me.  I understand the contents of this consent, which adequately explains the benefits and risks of the Services being provided via telemedicine.   I have been provided ample opportunity to ask questions regarding this consent and the Services and have had  my questions answered to my satisfaction.  I give my informed consent for the services to be provided through the use of telemedicine in my medical care  By participating in this telemedicine visit I agree to the above.

## 2019-10-26 ENCOUNTER — Telehealth (INDEPENDENT_AMBULATORY_CARE_PROVIDER_SITE_OTHER): Payer: Medicare Other | Admitting: Family Medicine

## 2019-10-26 ENCOUNTER — Encounter: Payer: Self-pay | Admitting: *Deleted

## 2019-10-26 ENCOUNTER — Encounter: Payer: Self-pay | Admitting: Cardiology

## 2019-10-26 VITALS — BP 168/93 | HR 65 | Ht 72.0 in | Wt 148.0 lb

## 2019-10-26 DIAGNOSIS — I10 Essential (primary) hypertension: Secondary | ICD-10-CM

## 2019-10-26 DIAGNOSIS — I480 Paroxysmal atrial fibrillation: Secondary | ICD-10-CM | POA: Diagnosis not present

## 2019-10-26 NOTE — Patient Instructions (Addendum)
Medication Instructions:   Your physician recommends that you continue on your current medications as directed. Please refer to the Current Medication list given to you today.  Labwork:  NONE  Testing/Procedures:  NONE  Follow-Up:  Your physician recommends that you schedule a follow-up appointment in: 10 months. You will receive a reminder letter in the mail in about 6-8 months reminding you to call and schedule your appointment. If you don't receive this letter, please contact our office.  Any Other Special Instructions Will Be Listed Below (If Applicable).  If you need a refill on your cardiac medications before your next appointment, please call your pharmacy.
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
## 2019-12-22 DIAGNOSIS — R41841 Cognitive communication deficit: Secondary | ICD-10-CM | POA: Diagnosis not present

## 2019-12-30 DIAGNOSIS — I480 Paroxysmal atrial fibrillation: Secondary | ICD-10-CM | POA: Diagnosis not present

## 2019-12-30 DIAGNOSIS — R413 Other amnesia: Secondary | ICD-10-CM | POA: Diagnosis not present

## 2020-01-16 DIAGNOSIS — Z23 Encounter for immunization: Secondary | ICD-10-CM | POA: Diagnosis not present

## 2020-04-14 ENCOUNTER — Other Ambulatory Visit: Payer: Self-pay

## 2020-04-14 ENCOUNTER — Encounter (HOSPITAL_COMMUNITY): Payer: Self-pay | Admitting: Emergency Medicine

## 2020-04-14 ENCOUNTER — Emergency Department (HOSPITAL_COMMUNITY)
Admission: EM | Admit: 2020-04-14 | Discharge: 2020-04-14 | Disposition: A | Discharge status: Home / Self Care | Payer: Medicare Other | Attending: Emergency Medicine | Admitting: Emergency Medicine

## 2020-04-14 ENCOUNTER — Emergency Department (HOSPITAL_COMMUNITY): Payer: Medicare Other

## 2020-04-14 DIAGNOSIS — S61412A Laceration without foreign body of left hand, initial encounter: Secondary | ICD-10-CM | POA: Diagnosis not present

## 2020-04-14 DIAGNOSIS — W312XXA Contact with powered woodworking and forming machines, initial encounter: Secondary | ICD-10-CM | POA: Diagnosis not present

## 2020-04-14 DIAGNOSIS — S61012A Laceration without foreign body of left thumb without damage to nail, initial encounter: Secondary | ICD-10-CM | POA: Diagnosis not present

## 2020-04-14 DIAGNOSIS — I4891 Unspecified atrial fibrillation: Secondary | ICD-10-CM | POA: Insufficient documentation

## 2020-04-14 DIAGNOSIS — I10 Essential (primary) hypertension: Secondary | ICD-10-CM | POA: Insufficient documentation

## 2020-04-14 DIAGNOSIS — Y929 Unspecified place or not applicable: Secondary | ICD-10-CM | POA: Insufficient documentation

## 2020-04-14 DIAGNOSIS — Z23 Encounter for immunization: Secondary | ICD-10-CM | POA: Diagnosis not present

## 2020-04-14 DIAGNOSIS — Z8546 Personal history of malignant neoplasm of prostate: Secondary | ICD-10-CM | POA: Insufficient documentation

## 2020-04-14 DIAGNOSIS — Z87891 Personal history of nicotine dependence: Secondary | ICD-10-CM | POA: Diagnosis not present

## 2020-04-14 DIAGNOSIS — Z79899 Other long term (current) drug therapy: Secondary | ICD-10-CM | POA: Insufficient documentation

## 2020-04-14 DIAGNOSIS — Y939 Activity, unspecified: Secondary | ICD-10-CM | POA: Diagnosis not present

## 2020-04-14 DIAGNOSIS — Y999 Unspecified external cause status: Secondary | ICD-10-CM | POA: Insufficient documentation

## 2020-04-14 DIAGNOSIS — Z7982 Long term (current) use of aspirin: Secondary | ICD-10-CM | POA: Insufficient documentation

## 2020-04-14 MED ORDER — TETANUS-DIPHTH-ACELL PERTUSSIS 5-2.5-18.5 LF-MCG/0.5 IM SUSP
0.5000 mL | Freq: Once | INTRAMUSCULAR | Status: AC
  Administered 2020-04-14: 15:00:00 0.5 mL via INTRAMUSCULAR
  Filled 2020-04-14: qty 0.5

## 2020-04-14 MED ORDER — LIDOCAINE HCL (PF) 1 % IJ SOLN
5.0000 mL | Freq: Once | INTRAMUSCULAR | Status: DC
  Filled 2020-04-14: qty 30

## 2020-04-14 MED ORDER — BACITRACIN-NEOMYCIN-POLYMYXIN 400-5-5000 EX OINT
TOPICAL_OINTMENT | Freq: Once | CUTANEOUS | Status: AC
  Administered 2020-04-14: 1 via TOPICAL
  Filled 2020-04-14: qty 1
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
## 2020-04-14 NOTE — ED Triage Notes (Signed)
Patient c/o laceration to left thumb. Per patient cut thumb with chainsaw. Deep laceration noted. Bleeding controled. Patient states he believes Tetanus vaccination is up-to-date by Dr Willey Blade.

## 2020-04-14 NOTE — Discharge Instructions (Addendum)
You were seen in the emergency department today for a laceration. Your laceration was closed with 4 stitches. Please keep this area clean and dry for the next 24 hours, after 24 hours you may get this area wet, but avoid soaking the area. Keep the area covered as best possible especially when in the sun to help in minimizing scarring.   Your tetanus has been updated   You will need to have the stitches removed and the wound rechecked in 8-10 days. Please return to the emergency department, go to an urgent care, or see your primary care provider to have this performed. Return to the ER soon should you start to experience pus type drainage from the wound, redness around the wound, or fevers as this could indicate the area is infected, please return to the ER for any other worsening symptoms or concerns that you may have.   Please also follow-up with your primary care provider within 1 week for recheck of your blood pressure as it was elevated in the ER today.  Vitals:   04/14/20 1351  BP: (!) 174/94  Pulse: 62  Resp: 18  Temp: 97.8 F (36.6 C)  SpO2: 97%

## 2020-04-20 ENCOUNTER — Encounter (HOSPITAL_COMMUNITY): Payer: Self-pay | Admitting: *Deleted

## 2020-04-20 ENCOUNTER — Other Ambulatory Visit: Payer: Self-pay

## 2020-04-20 ENCOUNTER — Emergency Department (HOSPITAL_COMMUNITY)
Admission: EM | Admit: 2020-04-20 | Discharge: 2020-04-20 | Disposition: A | Discharge status: Home / Self Care | Payer: Medicare Other | Attending: Emergency Medicine | Admitting: Emergency Medicine

## 2020-04-20 DIAGNOSIS — X58XXXD Exposure to other specified factors, subsequent encounter: Secondary | ICD-10-CM | POA: Insufficient documentation

## 2020-04-20 DIAGNOSIS — Z79899 Other long term (current) drug therapy: Secondary | ICD-10-CM | POA: Insufficient documentation

## 2020-04-20 DIAGNOSIS — S61012D Laceration without foreign body of left thumb without damage to nail, subsequent encounter: Secondary | ICD-10-CM | POA: Diagnosis not present

## 2020-04-20 DIAGNOSIS — Z87891 Personal history of nicotine dependence: Secondary | ICD-10-CM | POA: Diagnosis not present

## 2020-04-20 DIAGNOSIS — Z4802 Encounter for removal of sutures: Secondary | ICD-10-CM

## 2020-04-20 DIAGNOSIS — Z7982 Long term (current) use of aspirin: Secondary | ICD-10-CM | POA: Insufficient documentation

## 2020-04-20 DIAGNOSIS — I10 Essential (primary) hypertension: Secondary | ICD-10-CM | POA: Diagnosis not present

## 2020-04-20 NOTE — Discharge Instructions (Addendum)
Continue to keep your thumb dry for the next 2 days as these sterile strips continue to support the edges of your wound.  Starting on Monday it is okay to wash your hand normally.  The Steri-Strips should curl up and fall away on their own.  Get rechecked for any problems or concerns with this wound, it appears to be healing very well today.

## 2020-04-20 NOTE — ED Triage Notes (Signed)
Suture removal left thumb
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
## 2020-06-14 ENCOUNTER — Other Ambulatory Visit: Payer: Self-pay | Admitting: Internal Medicine

## 2020-06-14 ENCOUNTER — Other Ambulatory Visit (HOSPITAL_COMMUNITY): Payer: Self-pay | Admitting: Internal Medicine

## 2020-06-14 DIAGNOSIS — R269 Unspecified abnormalities of gait and mobility: Secondary | ICD-10-CM

## 2020-06-19 DIAGNOSIS — R634 Abnormal weight loss: Secondary | ICD-10-CM | POA: Diagnosis not present

## 2020-06-19 DIAGNOSIS — R4182 Altered mental status, unspecified: Secondary | ICD-10-CM | POA: Diagnosis not present

## 2020-06-19 DIAGNOSIS — R270 Ataxia, unspecified: Secondary | ICD-10-CM | POA: Diagnosis not present

## 2020-06-19 DIAGNOSIS — I6782 Cerebral ischemia: Secondary | ICD-10-CM | POA: Diagnosis not present

## 2020-06-22 DIAGNOSIS — R404 Transient alteration of awareness: Secondary | ICD-10-CM | POA: Diagnosis not present

## 2020-06-22 DIAGNOSIS — G3184 Mild cognitive impairment, so stated: Secondary | ICD-10-CM | POA: Diagnosis not present

## 2020-06-22 DIAGNOSIS — H903 Sensorineural hearing loss, bilateral: Secondary | ICD-10-CM | POA: Diagnosis not present

## 2020-06-22 DIAGNOSIS — R296 Repeated falls: Secondary | ICD-10-CM | POA: Diagnosis not present

## 2020-06-22 DIAGNOSIS — R4189 Other symptoms and signs involving cognitive functions and awareness: Secondary | ICD-10-CM | POA: Diagnosis not present

## 2020-06-22 DIAGNOSIS — G4710 Hypersomnia, unspecified: Secondary | ICD-10-CM | POA: Diagnosis not present

## 2020-06-23 DIAGNOSIS — R296 Repeated falls: Secondary | ICD-10-CM | POA: Insufficient documentation

## 2020-06-25 ENCOUNTER — Encounter (HOSPITAL_COMMUNITY): Payer: Self-pay

## 2020-06-25 ENCOUNTER — Ambulatory Visit (HOSPITAL_COMMUNITY): Admission: RE | Admit: 2020-06-25 | Payer: Medicare Other | Source: Ambulatory Visit

## 2020-06-26 DIAGNOSIS — G3184 Mild cognitive impairment, so stated: Secondary | ICD-10-CM | POA: Diagnosis not present

## 2020-06-26 DIAGNOSIS — I700 Atherosclerosis of aorta: Secondary | ICD-10-CM | POA: Diagnosis not present

## 2020-06-26 DIAGNOSIS — I480 Paroxysmal atrial fibrillation: Secondary | ICD-10-CM | POA: Diagnosis not present

## 2020-06-26 DIAGNOSIS — I10 Essential (primary) hypertension: Secondary | ICD-10-CM | POA: Diagnosis not present

## 2020-06-26 DIAGNOSIS — Z6822 Body mass index (BMI) 22.0-22.9, adult: Secondary | ICD-10-CM | POA: Diagnosis not present

## 2020-06-26 DIAGNOSIS — Z8546 Personal history of malignant neoplasm of prostate: Secondary | ICD-10-CM | POA: Diagnosis not present

## 2020-06-29 ENCOUNTER — Other Ambulatory Visit: Payer: Self-pay

## 2020-07-09 ENCOUNTER — Ambulatory Visit (HOSPITAL_COMMUNITY): Payer: Medicare Other

## 2020-07-24 DIAGNOSIS — I10 Essential (primary) hypertension: Secondary | ICD-10-CM | POA: Diagnosis not present

## 2020-07-24 DIAGNOSIS — I700 Atherosclerosis of aorta: Secondary | ICD-10-CM | POA: Diagnosis not present

## 2020-07-24 DIAGNOSIS — R001 Bradycardia, unspecified: Secondary | ICD-10-CM | POA: Diagnosis not present

## 2020-07-24 DIAGNOSIS — M5030 Other cervical disc degeneration, unspecified cervical region: Secondary | ICD-10-CM | POA: Diagnosis not present

## 2020-07-24 DIAGNOSIS — M1990 Unspecified osteoarthritis, unspecified site: Secondary | ICD-10-CM | POA: Diagnosis not present

## 2020-07-24 DIAGNOSIS — M47817 Spondylosis without myelopathy or radiculopathy, lumbosacral region: Secondary | ICD-10-CM | POA: Diagnosis not present

## 2020-07-24 DIAGNOSIS — M7551 Bursitis of right shoulder: Secondary | ICD-10-CM | POA: Diagnosis not present

## 2020-07-24 DIAGNOSIS — Z7982 Long term (current) use of aspirin: Secondary | ICD-10-CM | POA: Diagnosis not present

## 2020-07-24 DIAGNOSIS — E781 Pure hyperglyceridemia: Secondary | ICD-10-CM | POA: Diagnosis not present

## 2020-07-24 DIAGNOSIS — Z8546 Personal history of malignant neoplasm of prostate: Secondary | ICD-10-CM | POA: Diagnosis not present

## 2020-07-24 DIAGNOSIS — G3184 Mild cognitive impairment, so stated: Secondary | ICD-10-CM | POA: Diagnosis not present

## 2020-07-24 DIAGNOSIS — R7301 Impaired fasting glucose: Secondary | ICD-10-CM | POA: Diagnosis not present

## 2020-07-24 DIAGNOSIS — K219 Gastro-esophageal reflux disease without esophagitis: Secondary | ICD-10-CM | POA: Diagnosis not present

## 2020-07-24 DIAGNOSIS — J449 Chronic obstructive pulmonary disease, unspecified: Secondary | ICD-10-CM | POA: Diagnosis not present

## 2020-07-24 DIAGNOSIS — R412 Retrograde amnesia: Secondary | ICD-10-CM | POA: Diagnosis not present

## 2020-07-24 DIAGNOSIS — I480 Paroxysmal atrial fibrillation: Secondary | ICD-10-CM | POA: Diagnosis not present

## 2020-07-24 DIAGNOSIS — I959 Hypotension, unspecified: Secondary | ICD-10-CM | POA: Diagnosis not present

## 2020-07-24 DIAGNOSIS — R296 Repeated falls: Secondary | ICD-10-CM | POA: Diagnosis not present

## 2020-07-24 DIAGNOSIS — R3121 Asymptomatic microscopic hematuria: Secondary | ICD-10-CM | POA: Diagnosis not present

## 2020-07-24 DIAGNOSIS — Z87891 Personal history of nicotine dependence: Secondary | ICD-10-CM | POA: Diagnosis not present

## 2020-07-24 DIAGNOSIS — Z9181 History of falling: Secondary | ICD-10-CM | POA: Diagnosis not present

## 2020-07-25 DIAGNOSIS — R296 Repeated falls: Secondary | ICD-10-CM | POA: Diagnosis not present

## 2020-07-25 DIAGNOSIS — J449 Chronic obstructive pulmonary disease, unspecified: Secondary | ICD-10-CM | POA: Diagnosis not present

## 2020-07-25 DIAGNOSIS — I10 Essential (primary) hypertension: Secondary | ICD-10-CM | POA: Diagnosis not present

## 2020-07-25 DIAGNOSIS — I959 Hypotension, unspecified: Secondary | ICD-10-CM | POA: Diagnosis not present

## 2020-07-25 DIAGNOSIS — I480 Paroxysmal atrial fibrillation: Secondary | ICD-10-CM | POA: Diagnosis not present

## 2020-07-25 DIAGNOSIS — I700 Atherosclerosis of aorta: Secondary | ICD-10-CM | POA: Diagnosis not present

## 2020-07-28 DIAGNOSIS — I959 Hypotension, unspecified: Secondary | ICD-10-CM | POA: Diagnosis not present

## 2020-07-28 DIAGNOSIS — I10 Essential (primary) hypertension: Secondary | ICD-10-CM | POA: Diagnosis not present

## 2020-07-28 DIAGNOSIS — I480 Paroxysmal atrial fibrillation: Secondary | ICD-10-CM | POA: Diagnosis not present

## 2020-07-28 DIAGNOSIS — I700 Atherosclerosis of aorta: Secondary | ICD-10-CM | POA: Diagnosis not present

## 2020-07-28 DIAGNOSIS — R296 Repeated falls: Secondary | ICD-10-CM | POA: Diagnosis not present

## 2020-07-28 DIAGNOSIS — J449 Chronic obstructive pulmonary disease, unspecified: Secondary | ICD-10-CM | POA: Diagnosis not present

## 2020-07-30 DIAGNOSIS — I10 Essential (primary) hypertension: Secondary | ICD-10-CM | POA: Diagnosis not present

## 2020-07-30 DIAGNOSIS — R296 Repeated falls: Secondary | ICD-10-CM | POA: Diagnosis not present

## 2020-07-30 DIAGNOSIS — I480 Paroxysmal atrial fibrillation: Secondary | ICD-10-CM | POA: Diagnosis not present

## 2020-07-30 DIAGNOSIS — I700 Atherosclerosis of aorta: Secondary | ICD-10-CM | POA: Diagnosis not present

## 2020-07-30 DIAGNOSIS — I959 Hypotension, unspecified: Secondary | ICD-10-CM | POA: Diagnosis not present

## 2020-07-30 DIAGNOSIS — J449 Chronic obstructive pulmonary disease, unspecified: Secondary | ICD-10-CM | POA: Diagnosis not present

## 2020-08-01 DIAGNOSIS — R296 Repeated falls: Secondary | ICD-10-CM | POA: Diagnosis not present

## 2020-08-01 DIAGNOSIS — I959 Hypotension, unspecified: Secondary | ICD-10-CM | POA: Diagnosis not present

## 2020-08-01 DIAGNOSIS — J449 Chronic obstructive pulmonary disease, unspecified: Secondary | ICD-10-CM | POA: Diagnosis not present

## 2020-08-01 DIAGNOSIS — I700 Atherosclerosis of aorta: Secondary | ICD-10-CM | POA: Diagnosis not present

## 2020-08-01 DIAGNOSIS — I10 Essential (primary) hypertension: Secondary | ICD-10-CM | POA: Diagnosis not present

## 2020-08-01 DIAGNOSIS — I480 Paroxysmal atrial fibrillation: Secondary | ICD-10-CM | POA: Diagnosis not present

## 2020-08-07 DIAGNOSIS — I480 Paroxysmal atrial fibrillation: Secondary | ICD-10-CM | POA: Diagnosis not present

## 2020-08-07 DIAGNOSIS — I959 Hypotension, unspecified: Secondary | ICD-10-CM | POA: Diagnosis not present

## 2020-08-07 DIAGNOSIS — R296 Repeated falls: Secondary | ICD-10-CM | POA: Diagnosis not present

## 2020-08-07 DIAGNOSIS — J449 Chronic obstructive pulmonary disease, unspecified: Secondary | ICD-10-CM | POA: Diagnosis not present

## 2020-08-07 DIAGNOSIS — I700 Atherosclerosis of aorta: Secondary | ICD-10-CM | POA: Diagnosis not present

## 2020-08-07 DIAGNOSIS — I10 Essential (primary) hypertension: Secondary | ICD-10-CM | POA: Diagnosis not present

## 2020-08-09 DIAGNOSIS — I700 Atherosclerosis of aorta: Secondary | ICD-10-CM | POA: Diagnosis not present

## 2020-08-09 DIAGNOSIS — J449 Chronic obstructive pulmonary disease, unspecified: Secondary | ICD-10-CM | POA: Diagnosis not present

## 2020-08-09 DIAGNOSIS — I10 Essential (primary) hypertension: Secondary | ICD-10-CM | POA: Diagnosis not present

## 2020-08-09 DIAGNOSIS — I480 Paroxysmal atrial fibrillation: Secondary | ICD-10-CM | POA: Diagnosis not present

## 2020-08-09 DIAGNOSIS — I959 Hypotension, unspecified: Secondary | ICD-10-CM | POA: Diagnosis not present

## 2020-08-09 DIAGNOSIS — R296 Repeated falls: Secondary | ICD-10-CM | POA: Diagnosis not present

## 2020-08-10 DIAGNOSIS — I10 Essential (primary) hypertension: Secondary | ICD-10-CM | POA: Diagnosis not present

## 2020-08-10 DIAGNOSIS — I959 Hypotension, unspecified: Secondary | ICD-10-CM | POA: Diagnosis not present

## 2020-08-10 DIAGNOSIS — J449 Chronic obstructive pulmonary disease, unspecified: Secondary | ICD-10-CM | POA: Diagnosis not present

## 2020-08-10 DIAGNOSIS — I480 Paroxysmal atrial fibrillation: Secondary | ICD-10-CM | POA: Diagnosis not present

## 2020-08-10 DIAGNOSIS — I700 Atherosclerosis of aorta: Secondary | ICD-10-CM | POA: Diagnosis not present

## 2020-08-10 DIAGNOSIS — R296 Repeated falls: Secondary | ICD-10-CM | POA: Diagnosis not present

## 2020-08-15 DIAGNOSIS — I480 Paroxysmal atrial fibrillation: Secondary | ICD-10-CM | POA: Diagnosis not present

## 2020-08-15 DIAGNOSIS — I10 Essential (primary) hypertension: Secondary | ICD-10-CM | POA: Diagnosis not present

## 2020-08-15 DIAGNOSIS — J449 Chronic obstructive pulmonary disease, unspecified: Secondary | ICD-10-CM | POA: Diagnosis not present

## 2020-08-15 DIAGNOSIS — I700 Atherosclerosis of aorta: Secondary | ICD-10-CM | POA: Diagnosis not present

## 2020-08-15 DIAGNOSIS — I959 Hypotension, unspecified: Secondary | ICD-10-CM | POA: Diagnosis not present

## 2020-08-15 DIAGNOSIS — R296 Repeated falls: Secondary | ICD-10-CM | POA: Diagnosis not present

## 2020-08-20 DIAGNOSIS — I700 Atherosclerosis of aorta: Secondary | ICD-10-CM | POA: Diagnosis not present

## 2020-08-20 DIAGNOSIS — I10 Essential (primary) hypertension: Secondary | ICD-10-CM | POA: Diagnosis not present

## 2020-08-20 DIAGNOSIS — I959 Hypotension, unspecified: Secondary | ICD-10-CM | POA: Diagnosis not present

## 2020-08-20 DIAGNOSIS — R296 Repeated falls: Secondary | ICD-10-CM | POA: Diagnosis not present

## 2020-08-20 DIAGNOSIS — I480 Paroxysmal atrial fibrillation: Secondary | ICD-10-CM | POA: Diagnosis not present

## 2020-08-20 DIAGNOSIS — J449 Chronic obstructive pulmonary disease, unspecified: Secondary | ICD-10-CM | POA: Diagnosis not present

## 2020-08-22 DIAGNOSIS — J449 Chronic obstructive pulmonary disease, unspecified: Secondary | ICD-10-CM | POA: Diagnosis not present

## 2020-08-22 DIAGNOSIS — R296 Repeated falls: Secondary | ICD-10-CM | POA: Diagnosis not present

## 2020-08-22 DIAGNOSIS — I480 Paroxysmal atrial fibrillation: Secondary | ICD-10-CM | POA: Diagnosis not present

## 2020-08-22 DIAGNOSIS — I700 Atherosclerosis of aorta: Secondary | ICD-10-CM | POA: Diagnosis not present

## 2020-08-22 DIAGNOSIS — I10 Essential (primary) hypertension: Secondary | ICD-10-CM | POA: Diagnosis not present

## 2020-08-22 DIAGNOSIS — I959 Hypotension, unspecified: Secondary | ICD-10-CM | POA: Diagnosis not present

## 2020-08-23 DIAGNOSIS — Z7982 Long term (current) use of aspirin: Secondary | ICD-10-CM | POA: Diagnosis not present

## 2020-08-23 DIAGNOSIS — M1990 Unspecified osteoarthritis, unspecified site: Secondary | ICD-10-CM | POA: Diagnosis not present

## 2020-08-23 DIAGNOSIS — K219 Gastro-esophageal reflux disease without esophagitis: Secondary | ICD-10-CM | POA: Diagnosis not present

## 2020-08-23 DIAGNOSIS — R001 Bradycardia, unspecified: Secondary | ICD-10-CM | POA: Diagnosis not present

## 2020-08-23 DIAGNOSIS — E781 Pure hyperglyceridemia: Secondary | ICD-10-CM | POA: Diagnosis not present

## 2020-08-23 DIAGNOSIS — I480 Paroxysmal atrial fibrillation: Secondary | ICD-10-CM | POA: Diagnosis not present

## 2020-08-23 DIAGNOSIS — I959 Hypotension, unspecified: Secondary | ICD-10-CM | POA: Diagnosis not present

## 2020-08-23 DIAGNOSIS — J449 Chronic obstructive pulmonary disease, unspecified: Secondary | ICD-10-CM | POA: Diagnosis not present

## 2020-08-23 DIAGNOSIS — R7301 Impaired fasting glucose: Secondary | ICD-10-CM | POA: Diagnosis not present

## 2020-08-23 DIAGNOSIS — R296 Repeated falls: Secondary | ICD-10-CM | POA: Diagnosis not present

## 2020-08-23 DIAGNOSIS — M5030 Other cervical disc degeneration, unspecified cervical region: Secondary | ICD-10-CM | POA: Diagnosis not present

## 2020-08-23 DIAGNOSIS — M7551 Bursitis of right shoulder: Secondary | ICD-10-CM | POA: Diagnosis not present

## 2020-08-23 DIAGNOSIS — M47817 Spondylosis without myelopathy or radiculopathy, lumbosacral region: Secondary | ICD-10-CM | POA: Diagnosis not present

## 2020-08-23 DIAGNOSIS — G3184 Mild cognitive impairment, so stated: Secondary | ICD-10-CM | POA: Diagnosis not present

## 2020-08-23 DIAGNOSIS — Z9181 History of falling: Secondary | ICD-10-CM | POA: Diagnosis not present

## 2020-08-23 DIAGNOSIS — R412 Retrograde amnesia: Secondary | ICD-10-CM | POA: Diagnosis not present

## 2020-08-23 DIAGNOSIS — Z87891 Personal history of nicotine dependence: Secondary | ICD-10-CM | POA: Diagnosis not present

## 2020-08-23 DIAGNOSIS — Z8546 Personal history of malignant neoplasm of prostate: Secondary | ICD-10-CM | POA: Diagnosis not present

## 2020-08-23 DIAGNOSIS — I10 Essential (primary) hypertension: Secondary | ICD-10-CM | POA: Diagnosis not present

## 2020-08-23 DIAGNOSIS — I700 Atherosclerosis of aorta: Secondary | ICD-10-CM | POA: Diagnosis not present

## 2020-08-23 DIAGNOSIS — R3121 Asymptomatic microscopic hematuria: Secondary | ICD-10-CM | POA: Diagnosis not present
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
## 2020-08-24 ENCOUNTER — Ambulatory Visit: Payer: Medicare Other | Admitting: Urology

## 2020-08-28 DIAGNOSIS — I480 Paroxysmal atrial fibrillation: Secondary | ICD-10-CM | POA: Diagnosis not present

## 2020-08-28 DIAGNOSIS — I700 Atherosclerosis of aorta: Secondary | ICD-10-CM | POA: Diagnosis not present

## 2020-08-28 DIAGNOSIS — R296 Repeated falls: Secondary | ICD-10-CM | POA: Diagnosis not present

## 2020-08-28 DIAGNOSIS — J449 Chronic obstructive pulmonary disease, unspecified: Secondary | ICD-10-CM | POA: Diagnosis not present

## 2020-08-28 DIAGNOSIS — I959 Hypotension, unspecified: Secondary | ICD-10-CM | POA: Diagnosis not present

## 2020-08-28 DIAGNOSIS — I10 Essential (primary) hypertension: Secondary | ICD-10-CM | POA: Diagnosis not present

## 2020-08-29 DIAGNOSIS — I700 Atherosclerosis of aorta: Secondary | ICD-10-CM | POA: Diagnosis not present

## 2020-08-29 DIAGNOSIS — J449 Chronic obstructive pulmonary disease, unspecified: Secondary | ICD-10-CM | POA: Diagnosis not present

## 2020-08-29 DIAGNOSIS — R296 Repeated falls: Secondary | ICD-10-CM | POA: Diagnosis not present

## 2020-08-29 DIAGNOSIS — I480 Paroxysmal atrial fibrillation: Secondary | ICD-10-CM | POA: Diagnosis not present

## 2020-08-29 DIAGNOSIS — I10 Essential (primary) hypertension: Secondary | ICD-10-CM | POA: Diagnosis not present

## 2020-08-29 DIAGNOSIS — I959 Hypotension, unspecified: Secondary | ICD-10-CM | POA: Diagnosis not present

## 2020-08-30 DIAGNOSIS — G4733 Obstructive sleep apnea (adult) (pediatric): Secondary | ICD-10-CM | POA: Diagnosis not present

## 2020-08-30 DIAGNOSIS — R4189 Other symptoms and signs involving cognitive functions and awareness: Secondary | ICD-10-CM | POA: Diagnosis not present

## 2020-08-31 DIAGNOSIS — I480 Paroxysmal atrial fibrillation: Secondary | ICD-10-CM | POA: Diagnosis not present

## 2020-08-31 DIAGNOSIS — J449 Chronic obstructive pulmonary disease, unspecified: Secondary | ICD-10-CM | POA: Diagnosis not present

## 2020-08-31 DIAGNOSIS — I700 Atherosclerosis of aorta: Secondary | ICD-10-CM | POA: Diagnosis not present

## 2020-08-31 DIAGNOSIS — I10 Essential (primary) hypertension: Secondary | ICD-10-CM | POA: Diagnosis not present

## 2020-08-31 DIAGNOSIS — I959 Hypotension, unspecified: Secondary | ICD-10-CM | POA: Diagnosis not present

## 2020-08-31 DIAGNOSIS — R296 Repeated falls: Secondary | ICD-10-CM | POA: Diagnosis not present

## 2020-09-03 DIAGNOSIS — J449 Chronic obstructive pulmonary disease, unspecified: Secondary | ICD-10-CM | POA: Diagnosis not present

## 2020-09-03 DIAGNOSIS — I10 Essential (primary) hypertension: Secondary | ICD-10-CM | POA: Diagnosis not present

## 2020-09-03 DIAGNOSIS — I959 Hypotension, unspecified: Secondary | ICD-10-CM | POA: Diagnosis not present

## 2020-09-03 DIAGNOSIS — I700 Atherosclerosis of aorta: Secondary | ICD-10-CM | POA: Diagnosis not present

## 2020-09-03 DIAGNOSIS — R296 Repeated falls: Secondary | ICD-10-CM | POA: Diagnosis not present

## 2020-09-03 DIAGNOSIS — I480 Paroxysmal atrial fibrillation: Secondary | ICD-10-CM | POA: Diagnosis not present

## 2020-09-05 DIAGNOSIS — I10 Essential (primary) hypertension: Secondary | ICD-10-CM | POA: Diagnosis not present

## 2020-09-05 DIAGNOSIS — I480 Paroxysmal atrial fibrillation: Secondary | ICD-10-CM | POA: Diagnosis not present

## 2020-09-05 DIAGNOSIS — R296 Repeated falls: Secondary | ICD-10-CM | POA: Diagnosis not present

## 2020-09-05 DIAGNOSIS — I700 Atherosclerosis of aorta: Secondary | ICD-10-CM | POA: Diagnosis not present

## 2020-09-05 DIAGNOSIS — I959 Hypotension, unspecified: Secondary | ICD-10-CM | POA: Diagnosis not present

## 2020-09-05 DIAGNOSIS — J449 Chronic obstructive pulmonary disease, unspecified: Secondary | ICD-10-CM | POA: Diagnosis not present

## 2020-09-11 DIAGNOSIS — I959 Hypotension, unspecified: Secondary | ICD-10-CM | POA: Diagnosis not present

## 2020-09-11 DIAGNOSIS — I480 Paroxysmal atrial fibrillation: Secondary | ICD-10-CM | POA: Diagnosis not present

## 2020-09-11 DIAGNOSIS — I10 Essential (primary) hypertension: Secondary | ICD-10-CM | POA: Diagnosis not present

## 2020-09-11 DIAGNOSIS — I700 Atherosclerosis of aorta: Secondary | ICD-10-CM | POA: Diagnosis not present

## 2020-09-11 DIAGNOSIS — R296 Repeated falls: Secondary | ICD-10-CM | POA: Diagnosis not present

## 2020-09-11 DIAGNOSIS — J449 Chronic obstructive pulmonary disease, unspecified: Secondary | ICD-10-CM | POA: Diagnosis not present

## 2020-09-13 DIAGNOSIS — I10 Essential (primary) hypertension: Secondary | ICD-10-CM | POA: Diagnosis not present

## 2020-09-13 DIAGNOSIS — R296 Repeated falls: Secondary | ICD-10-CM | POA: Diagnosis not present

## 2020-09-13 DIAGNOSIS — J449 Chronic obstructive pulmonary disease, unspecified: Secondary | ICD-10-CM | POA: Diagnosis not present

## 2020-09-13 DIAGNOSIS — I959 Hypotension, unspecified: Secondary | ICD-10-CM | POA: Diagnosis not present

## 2020-09-13 DIAGNOSIS — I480 Paroxysmal atrial fibrillation: Secondary | ICD-10-CM | POA: Diagnosis not present

## 2020-09-13 DIAGNOSIS — I700 Atherosclerosis of aorta: Secondary | ICD-10-CM | POA: Diagnosis not present

## 2020-09-17 DIAGNOSIS — I959 Hypotension, unspecified: Secondary | ICD-10-CM | POA: Diagnosis not present

## 2020-09-17 DIAGNOSIS — I10 Essential (primary) hypertension: Secondary | ICD-10-CM | POA: Diagnosis not present

## 2020-09-17 DIAGNOSIS — I700 Atherosclerosis of aorta: Secondary | ICD-10-CM | POA: Diagnosis not present

## 2020-09-17 DIAGNOSIS — R296 Repeated falls: Secondary | ICD-10-CM | POA: Diagnosis not present

## 2020-09-17 DIAGNOSIS — J449 Chronic obstructive pulmonary disease, unspecified: Secondary | ICD-10-CM | POA: Diagnosis not present

## 2020-09-17 DIAGNOSIS — I480 Paroxysmal atrial fibrillation: Secondary | ICD-10-CM | POA: Diagnosis not present

## 2020-09-24 DIAGNOSIS — G3184 Mild cognitive impairment, so stated: Secondary | ICD-10-CM | POA: Diagnosis not present

## 2020-09-24 DIAGNOSIS — G4710 Hypersomnia, unspecified: Secondary | ICD-10-CM | POA: Diagnosis not present

## 2020-09-24 DIAGNOSIS — H903 Sensorineural hearing loss, bilateral: Secondary | ICD-10-CM | POA: Diagnosis not present

## 2020-09-25 DIAGNOSIS — Z23 Encounter for immunization: Secondary | ICD-10-CM | POA: Diagnosis not present

## 2021-12-06 ENCOUNTER — Emergency Department (HOSPITAL_COMMUNITY)
Admission: EM | Admit: 2021-12-06 | Discharge: 2021-12-06 | Discharge status: Against Medical Advice | Payer: Medicare Other | Attending: Emergency Medicine | Admitting: Emergency Medicine

## 2021-12-06 ENCOUNTER — Emergency Department (HOSPITAL_COMMUNITY): Payer: Medicare Other

## 2021-12-06 ENCOUNTER — Other Ambulatory Visit: Payer: Self-pay

## 2021-12-06 ENCOUNTER — Encounter (HOSPITAL_COMMUNITY): Payer: Self-pay

## 2021-12-06 DIAGNOSIS — Z5321 Procedure and treatment not carried out due to patient leaving prior to being seen by health care provider: Secondary | ICD-10-CM | POA: Insufficient documentation

## 2021-12-06 DIAGNOSIS — Z043 Encounter for examination and observation following other accident: Secondary | ICD-10-CM | POA: Insufficient documentation

## 2021-12-06 NOTE — ED Triage Notes (Signed)
Pt brought to ED via [place] following fall inside the bank where he tripped and fell. He hit his head on a stool. BP 188/93, HR 65. Pt denies LOC. Pt takes ASA daily.

## 2022-07-13 ENCOUNTER — Other Ambulatory Visit: Payer: Self-pay

## 2022-07-13 ENCOUNTER — Emergency Department (HOSPITAL_COMMUNITY)
Admission: EM | Admit: 2022-07-13 | Discharge: 2022-07-14 | Disposition: A | Discharge status: Home / Self Care | Payer: Medicare Other | Source: Home / Self Care | Attending: Student | Admitting: Student

## 2022-07-13 ENCOUNTER — Encounter (HOSPITAL_COMMUNITY): Payer: Self-pay | Admitting: Emergency Medicine

## 2022-07-13 ENCOUNTER — Emergency Department (HOSPITAL_COMMUNITY)
Admission: EM | Admit: 2022-07-13 | Discharge: 2022-07-13 | Disposition: A | Discharge status: Nursing Home (Includes ICF) | Payer: Medicare Other | Attending: Emergency Medicine | Admitting: Emergency Medicine

## 2022-07-13 ENCOUNTER — Emergency Department (HOSPITAL_COMMUNITY): Payer: Medicare Other

## 2022-07-13 DIAGNOSIS — N179 Acute kidney failure, unspecified: Secondary | ICD-10-CM | POA: Insufficient documentation

## 2022-07-13 DIAGNOSIS — N5089 Other specified disorders of the male genital organs: Secondary | ICD-10-CM | POA: Insufficient documentation

## 2022-07-13 DIAGNOSIS — I10 Essential (primary) hypertension: Secondary | ICD-10-CM | POA: Insufficient documentation

## 2022-07-13 DIAGNOSIS — Z7982 Long term (current) use of aspirin: Secondary | ICD-10-CM | POA: Insufficient documentation

## 2022-07-13 DIAGNOSIS — N433 Hydrocele, unspecified: Secondary | ICD-10-CM | POA: Insufficient documentation

## 2022-07-13 DIAGNOSIS — I700 Atherosclerosis of aorta: Secondary | ICD-10-CM | POA: Insufficient documentation

## 2022-07-13 DIAGNOSIS — R339 Retention of urine, unspecified: Secondary | ICD-10-CM | POA: Diagnosis not present

## 2022-07-13 DIAGNOSIS — Z9089 Acquired absence of other organs: Secondary | ICD-10-CM | POA: Insufficient documentation

## 2022-07-13 DIAGNOSIS — Z79899 Other long term (current) drug therapy: Secondary | ICD-10-CM | POA: Insufficient documentation

## 2022-07-13 DIAGNOSIS — K5730 Diverticulosis of large intestine without perforation or abscess without bleeding: Secondary | ICD-10-CM | POA: Insufficient documentation

## 2022-07-13 DIAGNOSIS — Z8546 Personal history of malignant neoplasm of prostate: Secondary | ICD-10-CM | POA: Diagnosis not present

## 2022-07-13 DIAGNOSIS — Z87891 Personal history of nicotine dependence: Secondary | ICD-10-CM | POA: Insufficient documentation

## 2022-07-13 DIAGNOSIS — R319 Hematuria, unspecified: Secondary | ICD-10-CM

## 2022-07-13 DIAGNOSIS — T839XXA Unspecified complication of genitourinary prosthetic device, implant and graft, initial encounter: Secondary | ICD-10-CM

## 2022-07-13 LAB — COMPREHENSIVE METABOLIC PANEL
ALT: 12 U/L (ref 0–44)
AST: 13 U/L — ABNORMAL LOW (ref 15–41)
Albumin: 3.4 g/dL — ABNORMAL LOW (ref 3.5–5.0)
Alkaline Phosphatase: 71 U/L (ref 38–126)
Anion gap: 5 (ref 5–15)
BUN: 27 mg/dL — ABNORMAL HIGH (ref 8–23)
CO2: 28 mmol/L (ref 22–32)
Calcium: 9 mg/dL (ref 8.9–10.3)
Chloride: 105 mmol/L (ref 98–111)
Creatinine, Ser: 0.91 mg/dL (ref 0.61–1.24)
GFR, Estimated: >60 mL/min (ref >60)
Glucose, Bld: 96 mg/dL (ref 70–99)
Potassium: 4 mmol/L (ref 3.5–5.1)
Sodium: 138 mmol/L (ref 135–145)
Total Bilirubin: 0.6 mg/dL (ref 0.3–1.2)
Total Protein: 6.1 g/dL — ABNORMAL LOW (ref 6.5–8.1)

## 2022-07-13 LAB — CBC WITH DIFFERENTIAL/PLATELET
Abs Immature Granulocytes: 0.02 10*3/uL (ref 0.00–0.07)
Basophils Absolute: 0.1 10*3/uL (ref 0.0–0.1)
Basophils Relative: 1 %
Eosinophils Absolute: 0.2 10*3/uL (ref 0.0–0.5)
Eosinophils Relative: 3 %
HCT: 39.3 % (ref 39.0–52.0)
Hemoglobin: 13.6 g/dL (ref 13.0–17.0)
Immature Granulocytes: 0 %
Lymphocytes Relative: 12 %
Lymphs Abs: 1.1 10*3/uL (ref 0.7–4.0)
MCH: 30.6 pg (ref 26.0–34.0)
MCHC: 34.6 g/dL (ref 30.0–36.0)
MCV: 88.5 fL (ref 80.0–100.0)
Monocytes Absolute: 0.9 10*3/uL (ref 0.1–1.0)
Monocytes Relative: 10 %
Neutro Abs: 6.6 10*3/uL (ref 1.7–7.7)
Neutrophils Relative %: 74 %
Platelets: 239 10*3/uL (ref 150–400)
RBC: 4.44 MIL/uL (ref 4.22–5.81)
RDW: 13.4 % (ref 11.5–15.5)
WBC: 8.8 10*3/uL (ref 4.0–10.5)
nRBC: 0 % (ref 0.0–0.2)

## 2022-07-13 LAB — URINALYSIS, ROUTINE W REFLEX MICROSCOPIC
Bacteria, UA: NONE SEEN
Bilirubin Urine: NEGATIVE
Glucose, UA: NEGATIVE mg/dL
Ketones, ur: NEGATIVE mg/dL
Leukocytes,Ua: NEGATIVE
Nitrite: NEGATIVE
Protein, ur: NEGATIVE mg/dL
Specific Gravity, Urine: 1.023 (ref 1.005–1.030)
pH: 6 (ref 5.0–8.0)

## 2022-07-13 MED ORDER — SODIUM CHLORIDE 0.9 % IV BOLUS
1000.0000 mL | Freq: Once | INTRAVENOUS | Status: AC
  Administered 2022-07-13: 1000 mL via INTRAVENOUS

## 2022-07-13 MED ORDER — CEPHALEXIN 500 MG PO CAPS: 500.0000 mg | ORAL_CAPSULE | Freq: Four times a day (QID) | ORAL | 0 refills | Status: DC

## 2022-07-13 MED ORDER — IOHEXOL 300 MG/ML  SOLN
100.0000 mL | Freq: Once | INTRAMUSCULAR | Status: AC | PRN
  Administered 2022-07-13: 100 mL via INTRAVENOUS
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
## 2022-07-13 NOTE — ED Triage Notes (Signed)
Pt from [facility] d/t pulling out his urinary catheter.

## 2022-07-13 NOTE — Discharge Instructions (Addendum)
Please take antibiotics as prescribed.  I would like you to follow-up with urology for further evaluation sometime next week.  Please return to the emergency department for any worsening symptoms.

## 2022-07-13 NOTE — ED Provider Notes (Addendum)
[facility] EMERGENCY DEPARTMENT Provider Note   CSN: [id number] Arrival date & time: 07/13/22  1129     History  Chief Complaint  Patient presents with   Groin Swelling    Glenn Bean is a 88 y.o. male with history of essential hypertension, paroxysmal atrial fibrillation, and dementia who presents to the emergency department today for further evaluation of a swollen left testicle.  Unclear how long this has been going on for.  Patient denies any testicular pain, urinary complaints, fever, chills.  HPI     Home Medications Prior to Admission medications   Medication Sig Start Date End Date Taking? Authorizing Provider  cephALEXin (KEFLEX) 500 MG capsule Take 1 capsule (500 mg total) by mouth 4 (four) times daily. 07/13/22  Yes Jeremih Dearmas M, PA-C  D2000 ULTRA STRENGTH 50 MCG (2000 UT) CAPS Take 2,000 Units by mouth daily. 05/25/22  Yes [provider]  donepezil (ARICEPT) 10 MG tablet Take 10 mg by mouth daily. 06/19/22  Yes [provider]  aspirin EC 81 MG tablet Take 81 mg by mouth daily.     [provider]  DILT-XR 180 MG 24 hr capsule Take by mouth daily. 06/07/18   [provider]  pantoprazole (PROTONIX) 40 MG tablet Take 40 mg by mouth every morning. 05/31/18   [provider]      Allergies    Patient has no known allergies.    Review of Systems   Review of Systems  All other systems reviewed and are negative.   Physical Exam Updated Vital Signs BP (!) 185/103   Pulse 80   Temp 98.5 F (36.9 C) (Oral)   Resp 18   Ht 6' (1.829 m)   Wt 74 kg   SpO2 99%   BMI 22.13 kg/m  Physical Exam Vitals and nursing note reviewed.  Constitutional:      Appearance: Normal appearance.  HENT:     Head: Normocephalic and atraumatic.  Eyes:     General:        Right eye: No discharge.        Left eye: No discharge.     Conjunctiva/sclera: Conjunctivae normal.  Pulmonary:     Effort: Pulmonary effort is normal.   Genitourinary:    Comments: Penis is normal and circumcised.  No rashes or lesions.  Left testicle is significantly swollen and tight.  Epididymis prominent on the left.  Right testicle is normal. Skin:    General: Skin is warm and dry.     Findings: No rash.  Neurological:     General: No focal deficit present.     Mental Status: He is alert.  Psychiatric:        Mood and Affect: Mood normal.        Behavior: Behavior normal.     ED Results / Procedures / Treatments   Labs (all labs ordered are listed, but only abnormal results are displayed) Labs Reviewed  URINALYSIS, ROUTINE W REFLEX MICROSCOPIC - Abnormal; Notable for the following components:      Result Value   Hgb urine dipstick SMALL (*)    All other components within normal limits  COMPREHENSIVE METABOLIC PANEL - Abnormal; Notable for the following components:   BUN 27 (*)    Total Protein 6.1 (*)    Albumin 3.4 (*)    AST 13 (*)    All other components within normal limits  CBC WITH DIFFERENTIAL/PLATELET    EKG None  Radiology CT  PELVIS W CONTRAST  Result Date: 07/13/2022 CLINICAL DATA:  Left groin lesion identified by ultrasound EXAM: CT PELVIS WITH CONTRAST TECHNIQUE: Multidetector CT imaging of the pelvis was performed using the standard protocol following the bolus administration of intravenous contrast. RADIATION DOSE REDUCTION: This exam was performed according to the departmental dose-optimization program which includes automated exposure control, adjustment of the mA and/or kV according to patient size and/or use of iterative reconstruction technique. CONTRAST:  122m OMNIPAQUE IOHEXOL 300 MG/ML  SOLN COMPARISON:  Scrotal ultrasound, 07/13/2022 CT abdomen pelvis, 10/13/2017 FINDINGS: Urinary Tract:  No abnormality visualized. Bowel:  Descending and sigmoid diverticulosis. Vascular/Lymphatic: Aortic atherosclerosis. No lymphadenopathy in the pelvis. Reproductive: TURP defect of the prostate. Faintly rim  enhancing, fluid attenuation lesion in the left hemiscrotum measuring 7.3 x 4.2 cm (series 2, image 61). The bilateral testicles proper are normal in appearance. Vasectomy clips. Other: Unchanged cystic lesion in the low pelvis interposed between the posterior bladder and rectum, measuring 9.7 x 4.8 cm (series 2, image 24). Musculoskeletal: No suspicious bone lesions identified. IMPRESSION: 1. Faintly rim enhancing, fluid attenuation lesion in the left hemiscrotum measuring 7.3 x 4.2 cm. The bilateral testicles proper are normal in appearance. In combination with ultrasound appearance, this generally suggests sequela of a hematoma. Consider urologic referral for further management and surveillance recommendations. 2. Unchanged cystic lesion in the low pelvis interposed between the posterior bladder and rectum, measuring 9.7 x 4.8 cm. Given location, this is possibly a large bladder diverticulum or a large seminal vesicle cyst, but at any rate benign given well-established stability. 3. Descending and sigmoid diverticulosis without evidence of acute diverticulitis. Aortic Atherosclerosis (ICD10-I70.0). Electronically Signed   By: ADelanna AhmadiM.D.   On: 07/13/2022 15:14   [place] W/DOPPLER  Result Date: 07/13/2022 CLINICAL DATA:  Testicular pain and swelling today, laterality not specified. History of right inguinal hernia repair and TURP. EXAM: SCROTAL ULTRASOUND DOPPLER ULTRASOUND OF THE TESTICLES TECHNIQUE: Complete ultrasound examination of the testicles, epididymis, and other scrotal structures was performed. Color and spectral Doppler ultrasound were also utilized to evaluate blood flow to the testicles. COMPARISON:  Abdominopelvic CT 10/13/2017 FINDINGS: Right testicle Measurements: 4.5 x 2.0 x 2.7 cm. Mildly heterogeneous echotexture with a prominent rete testis, but no focally suspicious abnormality. Increased vascularity on color Doppler. Left testicle Measurements: 4.3 x 2.3 x 3.0 cm. Mildly  heterogeneous echotexture with a prominent rete testis, but no focally suspicious abnormality. Increased vascularity on color Doppler. Right epididymis:  Normal in size and appearance. Left epididymis:  Normal in size and appearance. Hydrocele:  Small bilateral hydroceles. Varicocele:  None visualized. Pulsed Doppler interrogation of both testes demonstrates normal low resistance arterial and venous waveforms bilaterally. Other: There is a large heterogeneous multi septated cystic mass in the left inguinal canal which measures 7.3 x 3.7 x 4.4 cm. This has a uniformly echogenic component posteriorly which demonstrates some internal blood flow. IMPRESSION: 1. Large indeterminate complex mass in the left inguinal canal. This is not typical of a hernia or adenopathy and could reflect a hematoma. Correlate clinically. Recommend initial further evaluation with abdominopelvic CT with intravenous contrast. 2. Mild heterogeneity of both testes with relatively increased blood flow bilaterally. No focal abnormality or evidence of testicular torsion. 3. Small bilateral hydroceles. Electronically Signed   By: WRichardean SaleM.D.   On: 07/13/2022 13:11    Procedures Procedures    Medications Ordered in ED Medications  sodium chloride 0.9 % bolus 1,000 mL (has no administration in time range)  iohexol (OMNIPAQUE) 300 MG/ML solution 100 mL (100 mLs Intravenous Contrast Given 07/13/22 1444)    ED Course/ Medical Decision Making/ A&P Clinical Course as of 07/13/22 1640  Sun Jul 13, 2022  1214 I discussed this case with my attending physician who cosigned this note including patient's presenting symptoms, physical exam, and planned diagnostics and interventions. Attending physician stated agreement with plan or made changes to plan which were implemented.   Attending physician assessed patient at bedside.   [CF]  1214 Attempted to call Robley Fries for additional information on the patient and was sent to  voicemail. [CF]  1534 I spoke with Dr. Jeffie Pollock with urology who recommends following up in the outpatient setting based on the clinical scenario. [CF]  1600 Patient still unable to urinate.  Bladder scan revealed approximately 400 mL in the bladder.  Foley catheter will be placed and left in place concerned that the patient has been having some trouble with urination. [CF]  1607 800 mL of urine was obtained when Foley catheter was placed.  We will leave in place. [CF]    Clinical Course User Index [CF] Hendricks Limes, PA-C                           Medical Decision Making Glenn Bean is a 88 y.o. male patient who presents to the emergency department today for further evaluation of left testicular swelling.  Is unclear exactly how long this has been going on for.  I tried contacting [place] and was sent to voicemail.  Patient states he denies any testicular pain at this time but given the significant swelling and concern for possible torsion versus epididymitis versus orchitis.  Will evaluate with urinalysis in addition to ultrasound of the scrotum to rule out torsion.  Given the clinical scenario, I do feel the patient is stable for discharge at this time.  I will give him Keflex prophylactically.  Urinalysis does not reveal any significant signs of UTI but considering that he has been having some urinary symptoms and hematuria with this left swollen testicle I will start him on Keflex.  We will have him follow-up with urology next week for further evaluation.  He was encouraged to return to the emergency department for any worsening symptoms.  Family is at bedside.  I went over all labs and imaging results with them and the plan.  They were in agreement.  He is safe for discharge at this time.  Amount and/or Complexity of Data Reviewed Labs: ordered. Radiology: ordered.  Risk Prescription drug management.   Final Clinical Impression(s) / ED Diagnoses Final diagnoses:   Testicular swelling, left  Hematuria, unspecified type    Rx / DC Orders ED Discharge Orders          Ordered    cephALEXin (KEFLEX) 500 MG capsule  4 times daily        07/13/22 1636              Hendricks Limes, PA-C 07/13/22 1638    Myna Bright M, PA-C 07/13/22 1640    Milton Ferguson, MD 07/21/22 [phone]

## 2022-07-13 NOTE — ED Triage Notes (Signed)
Pt BIB [facility] from [place] with reports of testicle swelling.

## 2022-07-14 DIAGNOSIS — R339 Retention of urine, unspecified: Secondary | ICD-10-CM

## 2022-07-14 DIAGNOSIS — N433 Hydrocele, unspecified: Secondary | ICD-10-CM

## 2022-07-14 DIAGNOSIS — Z8546 Personal history of malignant neoplasm of prostate: Secondary | ICD-10-CM

## 2022-07-14 MED ORDER — HALOPERIDOL LACTATE 5 MG/ML IJ SOLN
2.0000 mg | Freq: Once | INTRAMUSCULAR | Status: AC
  Administered 2022-07-14: 2 mg via INTRAMUSCULAR
  Filled 2022-07-14: qty 1

## 2022-07-14 MED ORDER — LORAZEPAM 2 MG/ML IJ SOLN
1.0000 mg | Freq: Once | INTRAMUSCULAR | Status: AC
  Administered 2022-07-14: 1 mg via INTRAMUSCULAR
  Filled 2022-07-14: qty 1
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
## 2022-07-14 NOTE — ED Notes (Signed)
EDP Dr stated Urology Dr was coming to pt bedside instead of going to [place].

## 2022-07-14 NOTE — ED Notes (Signed)
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

## 2022-07-14 NOTE — ED Notes (Signed)
Attempted to place 36ffoley with no success, attempted a 132F coude with Dr KMatilde Sprangassist with ultrasound no success.

## 2022-07-14 NOTE — ED Notes (Addendum)
Ray aware father does not have to go to [place].

## 2022-07-14 NOTE — Discharge Instructions (Signed)
It was a pleasure caring for you today in the emergency department. ° °Please return to the emergency department for any worsening or worrisome symptoms. ° ° °

## 2022-07-14 NOTE — ED Notes (Signed)
Attempted to call wife for consent for pt to go to [facility] and no answer. Nurse called son he he gave verbal consent with 2 nurse verify.

## 2022-07-14 NOTE — ED Notes (Addendum)
Dr Matilde Sprang attempted a 58F coude catheter with no success. Also used an urojet for comfort. Urology will be consulted at 8 am

## 2022-07-14 NOTE — ED Notes (Signed)
Son requests family be called first for discharge ride.
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
## 2022-07-14 NOTE — ED Notes (Signed)
CSW updated by pts RN that MD has placed [facility] orders and that pt will be discharging. CSW reached out to Judson Roch with [facility] to see if they can accept [facility] referral as pt is a resident at [facility]. CSW awaiting response at this time. TOC to follow.

## 2022-07-15 ENCOUNTER — Other Ambulatory Visit: Payer: Self-pay

## 2022-07-15 ENCOUNTER — Emergency Department (HOSPITAL_COMMUNITY)
Admission: EM | Admit: 2022-07-15 | Discharge: 2022-07-15 | Disposition: A | Discharge status: Skilled Nursing Facility | Payer: Medicare Other | Attending: Emergency Medicine | Admitting: Emergency Medicine

## 2022-07-15 ENCOUNTER — Encounter (HOSPITAL_COMMUNITY): Payer: Self-pay | Admitting: Emergency Medicine

## 2022-07-15 DIAGNOSIS — Z8546 Personal history of malignant neoplasm of prostate: Secondary | ICD-10-CM | POA: Insufficient documentation

## 2022-07-15 DIAGNOSIS — S51012A Laceration without foreign body of left elbow, initial encounter: Secondary | ICD-10-CM | POA: Diagnosis present

## 2022-07-15 DIAGNOSIS — Y92129 Unspecified place in nursing home as the place of occurrence of the external cause: Secondary | ICD-10-CM | POA: Diagnosis not present

## 2022-07-15 DIAGNOSIS — F0390 Unspecified dementia without behavioral disturbance: Secondary | ICD-10-CM | POA: Insufficient documentation

## 2022-07-15 DIAGNOSIS — N433 Hydrocele, unspecified: Secondary | ICD-10-CM | POA: Diagnosis not present

## 2022-07-15 DIAGNOSIS — W19XXXA Unspecified fall, initial encounter: Secondary | ICD-10-CM | POA: Diagnosis not present

## 2022-07-15 DIAGNOSIS — S61411A Laceration without foreign body of right hand, initial encounter: Secondary | ICD-10-CM | POA: Diagnosis not present

## 2022-07-15 DIAGNOSIS — Z7982 Long term (current) use of aspirin: Secondary | ICD-10-CM | POA: Insufficient documentation

## 2022-07-15 DIAGNOSIS — R339 Retention of urine, unspecified: Secondary | ICD-10-CM | POA: Diagnosis not present

## 2022-07-15 MED ORDER — BACITRACIN ZINC 500 UNIT/GM EX OINT
TOPICAL_OINTMENT | CUTANEOUS | Status: AC
  Filled 2022-07-15: qty 2.7

## 2022-07-15 NOTE — ED Notes (Signed)
Notified [facility] of patient needing transportation back to [facility].  

## 2022-07-15 NOTE — Discharge Instructions (Signed)
Local wound care with bacitracin and dressing changes once daily.  Return to the emergency department for any new and/or concerning symptoms.

## 2022-07-15 NOTE — ED Triage Notes (Signed)
Pt has dementia and took witnessed fall. Facility states he did not hit his head. Pt has skin tear to elbow and finger.

## 2022-07-15 NOTE — ED Notes (Signed)
Wounds to left elbow and right index cleaned, dressed with bacitracin and wrapped in nonstick gauze and Coban.

## 2022-07-15 NOTE — ED Notes (Signed)
Report given to [facility]. EMS to take back; facility does not have anyone to get him until after 8am

## 2022-07-15 NOTE — ED Provider Notes (Signed)
[facility] EMERGENCY DEPARTMENT Provider Note   CSN: [id number] Arrival date & time: 07/15/22  0430     History  Chief Complaint  Patient presents with   Glenn Bean is a 88 y.o. male.  Patient is an 88 year old male with past medical history of dementia, atrial flutter, chronic subdural hematoma, and prostate cancer.  Patient brought by EMS after a fall at his extended care facility.  I am told the fall was witnessed and he slid from the bed to the floor.  There was no head trauma.  Patient denies to me that he struck his head.  He denies headache, loss of consciousness, neck pain, or other complaints.  Patient does seem to have sustained skin tears to the left elbow and right hand.  These were dressed by EMS, then patient was transported here.  He does have history of dementia which limits the history somewhat, but he does seem to be able to tell me what happened prior to his coming here.  The history is provided by the patient.       Home Medications Prior to Admission medications   Medication Sig Start Date End Date Taking? Authorizing Provider  aspirin EC 81 MG tablet Take 81 mg by mouth daily.     [provider]  cephALEXin (KEFLEX) 500 MG capsule Take 1 capsule (500 mg total) by mouth 4 (four) times daily. 07/13/22   Fleming, Conner M, PA-C  D2000 ULTRA STRENGTH 50 MCG (2000 UT) CAPS Take 2,000 Units by mouth daily. 05/25/22   [provider]  DILT-XR 180 MG 24 hr capsule Take by mouth daily. 06/07/18   [provider]  donepezil (ARICEPT) 10 MG tablet Take 10 mg by mouth daily. 06/19/22   [provider]  pantoprazole (PROTONIX) 40 MG tablet Take 40 mg by mouth every morning. 05/31/18   [provider]      Allergies    Patient has no known allergies.    Review of Systems   Review of Systems  Unable to perform ROS: Dementia  All other systems reviewed and are negative.   Physical Exam Updated Vital Signs BP (!)  166/101   Pulse 86   Temp 97.8 F (36.6 C) (Oral)   Resp 18   Ht 6' (1.829 m)   Wt 74 kg   SpO2 93%   BMI 22.13 kg/m  Physical Exam Vitals and nursing note reviewed.  Constitutional:      General: He is not in acute distress.    Appearance: He is well-developed. He is not diaphoretic.  HENT:     Head: Normocephalic and atraumatic.     Comments: Examination of the head reveals no overt signs of trauma. Eyes:     Extraocular Movements: Extraocular movements intact.     Pupils: Pupils are equal, round, and reactive to light.  Cardiovascular:     Rate and Rhythm: Normal rate and regular rhythm.     Heart sounds: No murmur heard.    No friction rub.  Pulmonary:     Effort: Pulmonary effort is normal. No respiratory distress.     Breath sounds: Normal breath sounds. No wheezing or rales.  Abdominal:     General: Bowel sounds are normal. There is no distension.     Palpations: Abdomen is soft.     Tenderness: There is no abdominal tenderness.  Musculoskeletal:        General: Normal range of motion.  Cervical back: Normal range of motion and neck supple.     Comments: There are superficial skin tears noted to the left elbow and right hand/right index finger.  These are well approximated and no missing tissue.  He has full range of motion of both hips with no discomfort.  Skin:    General: Skin is warm and dry.  Neurological:     General: No focal deficit present.     Mental Status: He is alert and oriented to person, place, and time.     Cranial Nerves: No cranial nerve deficit.     Coordination: Coordination normal.     ED Results / Procedures / Treatments   Labs (all labs ordered are listed, but only abnormal results are displayed) Labs Reviewed - No data to display  EKG None  Radiology CT PELVIS W CONTRAST  Result Date: 07/13/2022 CLINICAL DATA:  Left groin lesion identified by ultrasound EXAM: CT PELVIS WITH CONTRAST TECHNIQUE: Multidetector CT imaging of the  pelvis was performed using the standard protocol following the bolus administration of intravenous contrast. RADIATION DOSE REDUCTION: This exam was performed according to the departmental dose-optimization program which includes automated exposure control, adjustment of the mA and/or kV according to patient size and/or use of iterative reconstruction technique. CONTRAST:  186m OMNIPAQUE IOHEXOL 300 MG/ML  SOLN COMPARISON:  Scrotal ultrasound, 07/13/2022 CT abdomen pelvis, 10/13/2017 FINDINGS: Urinary Tract:  No abnormality visualized. Bowel:  Descending and sigmoid diverticulosis. Vascular/Lymphatic: Aortic atherosclerosis. No lymphadenopathy in the pelvis. Reproductive: TURP defect of the prostate. Faintly rim enhancing, fluid attenuation lesion in the left hemiscrotum measuring 7.3 x 4.2 cm (series 2, image 61). The bilateral testicles proper are normal in appearance. Vasectomy clips. Other: Unchanged cystic lesion in the low pelvis interposed between the posterior bladder and rectum, measuring 9.7 x 4.8 cm (series 2, image 24). Musculoskeletal: No suspicious bone lesions identified. IMPRESSION: 1. Faintly rim enhancing, fluid attenuation lesion in the left hemiscrotum measuring 7.3 x 4.2 cm. The bilateral testicles proper are normal in appearance. In combination with ultrasound appearance, this generally suggests sequela of a hematoma. Consider urologic referral for further management and surveillance recommendations. 2. Unchanged cystic lesion in the low pelvis interposed between the posterior bladder and rectum, measuring 9.7 x 4.8 cm. Given location, this is possibly a large bladder diverticulum or a large seminal vesicle cyst, but at any rate benign given well-established stability. 3. Descending and sigmoid diverticulosis without evidence of acute diverticulitis. Aortic Atherosclerosis (ICD10-I70.0). Electronically Signed   By: ADelanna AhmadiM.D.   On: 07/13/2022 15:14   UKoreaSCROTUM W/DOPPLER  Result  Date: 07/13/2022 CLINICAL DATA:  Testicular pain and swelling today, laterality not specified. History of right inguinal hernia repair and TURP. EXAM: SCROTAL ULTRASOUND DOPPLER ULTRASOUND OF THE TESTICLES TECHNIQUE: Complete ultrasound examination of the testicles, epididymis, and other scrotal structures was performed. Color and spectral Doppler ultrasound were also utilized to evaluate blood flow to the testicles. COMPARISON:  Abdominopelvic CT 10/13/2017 FINDINGS: Right testicle Measurements: 4.5 x 2.0 x 2.7 cm. Mildly heterogeneous echotexture with a prominent rete testis, but no focally suspicious abnormality. Increased vascularity on color Doppler. Left testicle Measurements: 4.3 x 2.3 x 3.0 cm. Mildly heterogeneous echotexture with a prominent rete testis, but no focally suspicious abnormality. Increased vascularity on color Doppler. Right epididymis:  Normal in size and appearance. Left epididymis:  Normal in size and appearance. Hydrocele:  Small bilateral hydroceles. Varicocele:  None visualized. Pulsed Doppler interrogation of both testes demonstrates normal low resistance  arterial and venous waveforms bilaterally. Other: There is a large heterogeneous multi septated cystic mass in the left inguinal canal which measures 7.3 x 3.7 x 4.4 cm. This has a uniformly echogenic component posteriorly which demonstrates some internal blood flow. IMPRESSION: 1. Large indeterminate complex mass in the left inguinal canal. This is not typical of a hernia or adenopathy and could reflect a hematoma. Correlate clinically. Recommend initial further evaluation with abdominopelvic CT with intravenous contrast. 2. Mild heterogeneity of both testes with relatively increased blood flow bilaterally. No focal abnormality or evidence of testicular torsion. 3. Small bilateral hydroceles. Electronically Signed   By: Richardean Sale M.D.   On: 07/13/2022 13:11    Procedures Procedures    Medications Ordered in ED Medications  - No data to display  ED Course/ Medical Decision Making/ A&P  Patient brought by EMS for evaluation of a fall that occurred at his extended care facility.  Only injury seems to be skin tears of the left elbow and right hand.  I am told that the fall was witnessed and patient did not strike his head.  He appears neurologically intact and at his baseline.  I do not feel as though any imaging studies are indicated and that patient can safely be returned to his extended care facility.  The skin tears on his hand and elbow were dressed with bacitracin and nonadherent dressings.  He is to have these dressings changed once daily and return as needed for any problems.  Final Clinical Impression(s) / ED Diagnoses Final diagnoses:  None    Rx / DC Orders ED Discharge Orders     None         Veryl Speak, MD 07/15/22 [phone]

## 2022-07-16 ENCOUNTER — Telehealth: Payer: Self-pay

## 2022-07-16 NOTE — Telephone Encounter (Signed)
Spoke with Sharyn Lull (Referral coordinator) at [facility] regarding patient appt being cancel. Sharyn Lull stated that MR. Mullen was giving a early appt in [facility] for Urology.

## 2022-07-29 ENCOUNTER — Ambulatory Visit: Payer: 59 | Admitting: Urology

## 2022-08-07 ENCOUNTER — Encounter: Payer: Self-pay | Admitting: Nurse Practitioner

## 2022-08-07 ENCOUNTER — Telehealth: Payer: Medicare Other | Admitting: Nurse Practitioner

## 2022-08-07 DIAGNOSIS — R634 Abnormal weight loss: Secondary | ICD-10-CM

## 2022-08-07 DIAGNOSIS — Z515 Encounter for palliative care: Secondary | ICD-10-CM

## 2022-08-07 DIAGNOSIS — R413 Other amnesia: Secondary | ICD-10-CM

## 2022-08-07 DIAGNOSIS — R5381 Other malaise: Secondary | ICD-10-CM

## 2022-08-07 NOTE — Progress Notes (Signed)
[facility] Palliative Care Consult Note Telephone: [phone]  Fax: [phone]   Date of encounter: 08/07/22 2:13 PM PATIENT NAME: Glenn Bean [place] [address]   [phone] (home)  DOB: [birth year] MRN: [id number] PRIMARY CARE PROVIDER:    Asencion Noble, MD,  [address] [place] [address] [phone]  REFERRING PROVIDER:   Asencion Noble, [place] [address],  [place] [address] [phone]  RESPONSIBLE PARTY:    Contact Information     Name Relation Home Work Mobile   Sehgal,Betty Spouse Vining Pandora Leiter [phone]   Koopmann,Cathy Daughter   [phone]      Due to the COVID-19 crisis, this visit was done via telemedicine from my office and it was initiated and consent by this patient and or family.  I connected with Lovey Newcomer, at [facility] staff;  [name] OR PROXY on 08/07/22 by a video enabled telemedicine application and verified that I am speaking with the correct person.   I discussed the limitations of evaluation and management by telemedicine. The patient expressed understanding and agreed to proceed.  Palliative Care was asked to follow this patient by consultation request of  Asencion Noble, MD to address advance care planning and complex medical decision making. This is the initial visit.                                ASSESSMENT AND PLAN / RECOMMENDATIONS:  Symptom Management/Plan: 1. Advance Care Planning;  DNR reviewed with staff; pending discussions with family could possible be hospice eligible with weight loss, overall decline functionally, cognitively will further discuss with family when returns call 2. Goals of Care: Goals include to maximize quality of life and symptom management. Our advance care planning conversation included a discussion about:    The value and importance of advance care  planning  Exploration of personal, cultural or spiritual beliefs that might influence medical decisions  Exploration of goals of care in the event of a sudden injury or illness  Identification and preparation of a healthcare agent  Review and updating or creation of an advance directive document.  3. Weight loss with memory impairment; reviewed diet, nutirtion, weights, continue to monitor weights; supplements; encourage to eat, meals, snacks  06/14/2013 weight 185 lbs 11/2021 weight 165 lbs 04/07/2022 weight 142.2 lbs 05/08/2022 weight 146.8 lbs 06/07/2022 weight 146.5 lbs 07/08/2022 weight 143.8 lbs 22.8 lbs loss/9 months with 13.21% 7.8 lbs loss/5 months with 5.54% 2.7 lbs loss/2 months with 2.45%  4. Debility, deconditioning with memory impairment; continue to monitor for falls, fall risk; encourage mobility as able with staff; encourage oob, activities;  5. Palliative care encounter; Palliative care encounter; Palliative medicine team will continue to support patient, patient's family, and medical team. Visit consisted of counseling and education dealing with the complex and emotionally intense issues of symptom management and palliative care in the setting of serious and potentially life-threatening illness  Follow up Palliative Care Visit: Palliative care will continue to follow for complex medical decision making, advance care planning, and clarification of goals. Return 2 weeks or prn.  I spent 61 minutes providing this consultation. More than 50% of the time in this consultation was spent in counseling and care coordination. PPS: 40%  Chief Complaint: Initial palliative consult for complex medical decision making, address goals, manage ongoing symptoms  HISTORY  OF PRESENT ILLNESS:  Glenn Bean is a 88 y.o. year old male  with multiple medical problems including h/o prostate cancer [id number] s/p TURP, ataxia, memory impairment, HTN, GERD, BPH. Mr Glenn Bean was moved to [facility]  unit at [facility]. I connected with Lovey Newcomer, staff at [facility] for telemedicine by video initial pc visit. We talked about how long Mr. Glenn Bean has been at [facility], events which lead him to be placed as well as his wife with dementia currently also residing at [facility] at [facility]. We talked about past medical history, family history, ros, appetite, weight loss, functional decline with recurrent falls, cognitive decline overall. We reviewed medical goals including DNR. Discussed currently his wife is under hospice services at the facility. Discussed with overall decline, weight loss Mr Glenn Bean maybe eligible as well. Will further discuss with family. Called [facility] [place] Regan no answer, message left requesting a call back with contact information. I visited with Mr Glenn Bean by video, he appears thin, elderly, was able to answer simple questions with the help of [place], staff. Support provided. Will see 2 weeks or sooner if declines or if family wishes for hospice will request av visit. Sandy in  agreement. Questions answered.   History obtained from review of EMR, discussion with Lovey Newcomer, staff [facility] with Mr. Curro.  I reviewed available labs, medications, imaging, studies and related documents from the EMR.  Records reviewed and summarized above.   ROS 10 point system reviewed with staff except HPI with +weight loss, +recurrent falls  Physical Exam: Deferred  CURRENT PROBLEM LIST:  Patient Active Problem List   Diagnosis Date Noted   Upper airway cough syndrome 01/17/2017   Shortness of breath 01/16/2017   CAP (community acquired pneumonia) 09/07/2016   Atrial flutter ([place]) 09/07/2016   AKI (acute kidney injury) ([place]) 09/07/2016   PNA (pneumonia) 09/07/2016   Subdural hematoma, chronic (HCC) 11/28/2011   Neck pain 10/08/2011   Neck muscle weakness 10/08/2011   Neck stiffness 10/08/2011   MALIGNANT NEOPLASM OF PROSTATE 02/27/2010   Esophageal reflux 02/27/2010    COLONIC POLYPS 04/21/2008   HEMORRHOIDS, INTERNAL 04/21/2008   EROSIVE ESOPHAGITIS 04/21/2008   ESOPHAGEAL STRICTURE 04/21/2008   Diaphragmatic hernia 04/21/2008   Diverticulosis of colon 04/21/2008   PAST MEDICAL HISTORY:  Active Ambulatory Problems    Diagnosis Date Noted   MALIGNANT NEOPLASM OF PROSTATE 02/27/2010   COLONIC POLYPS 04/21/2008   HEMORRHOIDS, INTERNAL 04/21/2008   EROSIVE ESOPHAGITIS 04/21/2008   ESOPHAGEAL STRICTURE 04/21/2008   Esophageal reflux 02/27/2010   Diaphragmatic hernia 04/21/2008   Diverticulosis of colon 04/21/2008   Neck pain 10/08/2011   Neck muscle weakness 10/08/2011   Neck stiffness 10/08/2011   Subdural hematoma, chronic ([place]) 11/28/2011   CAP (community acquired pneumonia) 09/07/2016   Atrial flutter ([place]) 09/07/2016   AKI (acute kidney injury) ([place]) 09/07/2016   PNA (pneumonia) 09/07/2016   Shortness of breath 01/16/2017   Upper airway cough syndrome 01/17/2017   Resolved Ambulatory Problems    Diagnosis Date Noted   No Resolved Ambulatory Problems   Past Medical History:  Diagnosis Date   Arthritis    Essential hypertension    GERD (gastroesophageal reflux disease)    PAF (paroxysmal atrial fibrillation) (HCC)    Subdural hematoma, post-traumatic (HCC)    SOCIAL HX:  Social History   Tobacco Use   Smoking status: Former    Packs/day: 1.00    Years: 55.00    Total pack years: 55.00  Types: Cigarettes    Start date: 08/03/1945    Quit date: 08/03/1989    Years since quitting: 33.0   Smokeless tobacco: Former    Types: Chew   Tobacco comments:    "Quit smoking 1990's  Substance Use Topics   Alcohol use: No   FAMILY HX:  Family History  Problem Relation Age of Onset   Heart disease Mother    Throat cancer Father       ALLERGIES: No Known Allergies   PERTINENT MEDICATIONS:  Outpatient Encounter Medications as of 08/07/2022  Medication Sig   aspirin EC 81 MG tablet Take 81 mg by mouth daily.    cephALEXin (KEFLEX)  500 MG capsule Take 1 capsule (500 mg total) by mouth 4 (four) times daily.   D2000 ULTRA STRENGTH 50 MCG (2000 UT) CAPS Take 2,000 Units by mouth daily.   DILT-XR 180 MG 24 hr capsule Take by mouth daily.   donepezil (ARICEPT) 10 MG tablet Take 10 mg by mouth daily.   pantoprazole (PROTONIX) 40 MG tablet Take 40 mg by mouth every morning.   No facility-administered encounter medications on file as of 08/07/2022.   Thank you for the opportunity to participate in the care of Mr. Bangs.  The palliative care team will continue to follow. Please call our office at [phone] if we can be of additional assistance.   Calistro Rauf Z Estelle Greenleaf, NP ,

## 2022-08-12 ENCOUNTER — Telehealth: Payer: Self-pay | Admitting: Nurse Practitioner

## 2022-08-12 NOTE — Telephone Encounter (Signed)
I called Johnnette Gourd, Mr. Regan's daughter, in law, clinical update discussed, talked about pmh, pc visit, weight loss, medical goals including option of hospice benefit under Medicare program. Currently Mr Regan's wife is under hospice services at [facility] where they reside. Cathy in agreement to proceed with hospice av visit, notified DON at [facility] for hospice order. Therapeutic listening, emotional support provided. Questions answered Total time 20 minutes Phone discussion 15 minutes Documentation 5 minutes

## 2022-08-14 ENCOUNTER — Ambulatory Visit: Payer: 59 | Admitting: Urology

## 2022-09-04 ENCOUNTER — Ambulatory Visit (INDEPENDENT_AMBULATORY_CARE_PROVIDER_SITE_OTHER): Admitting: Urology

## 2022-09-04 ENCOUNTER — Encounter: Payer: Self-pay | Admitting: Urology

## 2022-09-04 VITALS — BP 148/84 | HR 73

## 2022-09-04 DIAGNOSIS — R339 Retention of urine, unspecified: Secondary | ICD-10-CM

## 2022-09-04 DIAGNOSIS — N138 Other obstructive and reflux uropathy: Secondary | ICD-10-CM

## 2022-09-04 DIAGNOSIS — N323 Diverticulum of bladder: Secondary | ICD-10-CM | POA: Diagnosis not present

## 2022-09-04 DIAGNOSIS — N401 Enlarged prostate with lower urinary tract symptoms: Secondary | ICD-10-CM | POA: Diagnosis not present

## 2022-09-04 DIAGNOSIS — Z8546 Personal history of malignant neoplasm of prostate: Secondary | ICD-10-CM | POA: Diagnosis not present

## 2022-09-04 DIAGNOSIS — N430 Encysted hydrocele: Secondary | ICD-10-CM

## 2022-09-04 DIAGNOSIS — C61 Malignant neoplasm of prostate: Secondary | ICD-10-CM
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
## 2022-09-16 ENCOUNTER — Inpatient Hospital Stay (HOSPITAL_COMMUNITY)
Admission: EM | Admit: 2022-09-16 | Discharge: 2022-10-08 | DRG: 951 | Disposition: E | Source: Skilled Nursing Facility | Attending: Family Medicine | Admitting: Family Medicine

## 2022-09-16 ENCOUNTER — Other Ambulatory Visit: Payer: Self-pay

## 2022-09-16 ENCOUNTER — Encounter (HOSPITAL_COMMUNITY): Payer: Self-pay | Admitting: *Deleted

## 2022-09-16 DIAGNOSIS — R6510 Systemic inflammatory response syndrome (SIRS) of non-infectious origin without acute organ dysfunction: Secondary | ICD-10-CM | POA: Diagnosis present

## 2022-09-16 DIAGNOSIS — Z7189 Other specified counseling: Secondary | ICD-10-CM | POA: Diagnosis not present

## 2022-09-16 DIAGNOSIS — E43 Unspecified severe protein-calorie malnutrition: Secondary | ICD-10-CM | POA: Diagnosis present

## 2022-09-16 DIAGNOSIS — Z808 Family history of malignant neoplasm of other organs or systems: Secondary | ICD-10-CM

## 2022-09-16 DIAGNOSIS — Z8249 Family history of ischemic heart disease and other diseases of the circulatory system: Secondary | ICD-10-CM

## 2022-09-16 DIAGNOSIS — K219 Gastro-esophageal reflux disease without esophagitis: Secondary | ICD-10-CM | POA: Diagnosis present

## 2022-09-16 DIAGNOSIS — I6203 Nontraumatic chronic subdural hemorrhage: Secondary | ICD-10-CM | POA: Diagnosis present

## 2022-09-16 DIAGNOSIS — I4892 Unspecified atrial flutter: Secondary | ICD-10-CM | POA: Diagnosis present

## 2022-09-16 DIAGNOSIS — Z6822 Body mass index (BMI) 22.0-22.9, adult: Secondary | ICD-10-CM

## 2022-09-16 DIAGNOSIS — I10 Essential (primary) hypertension: Secondary | ICD-10-CM | POA: Diagnosis present

## 2022-09-16 DIAGNOSIS — Z87891 Personal history of nicotine dependence: Secondary | ICD-10-CM

## 2022-09-16 DIAGNOSIS — I4891 Unspecified atrial fibrillation: Secondary | ICD-10-CM | POA: Diagnosis not present

## 2022-09-16 DIAGNOSIS — I480 Paroxysmal atrial fibrillation: Secondary | ICD-10-CM | POA: Diagnosis present

## 2022-09-16 DIAGNOSIS — Z515 Encounter for palliative care: Principal | ICD-10-CM

## 2022-09-16 DIAGNOSIS — Z66 Do not resuscitate: Secondary | ICD-10-CM | POA: Diagnosis present

## 2022-09-16 DIAGNOSIS — N368 Other specified disorders of urethra: Secondary | ICD-10-CM

## 2022-09-16 DIAGNOSIS — G309 Alzheimer's disease, unspecified: Secondary | ICD-10-CM | POA: Diagnosis present

## 2022-09-16 DIAGNOSIS — F0390 Unspecified dementia without behavioral disturbance: Secondary | ICD-10-CM | POA: Diagnosis present

## 2022-09-16 DIAGNOSIS — J9691 Respiratory failure, unspecified with hypoxia: Secondary | ICD-10-CM

## 2022-09-16 DIAGNOSIS — F0280 Dementia in other diseases classified elsewhere without behavioral disturbance: Secondary | ICD-10-CM | POA: Diagnosis present

## 2022-09-16 DIAGNOSIS — Z79899 Other long term (current) drug therapy: Secondary | ICD-10-CM

## 2022-09-16 DIAGNOSIS — Z8546 Personal history of malignant neoplasm of prostate: Secondary | ICD-10-CM

## 2022-09-16 DIAGNOSIS — A419 Sepsis, unspecified organism: Secondary | ICD-10-CM

## 2022-09-16 DIAGNOSIS — M1990 Unspecified osteoarthritis, unspecified site: Secondary | ICD-10-CM | POA: Diagnosis present

## 2022-09-16 DIAGNOSIS — R6521 Severe sepsis with septic shock: Secondary | ICD-10-CM

## 2022-09-16 DIAGNOSIS — C61 Malignant neoplasm of prostate: Secondary | ICD-10-CM | POA: Diagnosis present

## 2022-09-16 LAB — CBG MONITORING, ED: Glucose-Capillary: 106 mg/dL — ABNORMAL HIGH (ref 70–99)

## 2022-09-16 MED ORDER — GLYCOPYRROLATE 1 MG PO TABS: 1.0000 mg | ORAL_TABLET | ORAL | Status: DC | PRN

## 2022-09-16 MED ORDER — GLYCOPYRROLATE 0.2 MG/ML IJ SOLN: 0.2000 mg | INTRAMUSCULAR | Status: DC | PRN

## 2022-09-16 MED ORDER — SODIUM CHLORIDE 0.9% FLUSH
3.0000 mL | Freq: Two times a day (BID) | INTRAVENOUS | Status: DC
  Administered 2022-09-17: 3 mL via INTRAVENOUS

## 2022-09-16 MED ORDER — ACETAMINOPHEN 650 MG RE SUPP: 650.0000 mg | Freq: Once | RECTAL | Status: DC

## 2022-09-16 MED ORDER — ACETAMINOPHEN 650 MG RE SUPP
RECTAL | Status: AC
  Filled 2022-09-16: qty 1

## 2022-09-16 MED ORDER — MORPHINE SULFATE (PF) 2 MG/ML IV SOLN
2.0000 mg | INTRAVENOUS | Status: DC | PRN
  Administered 2022-09-16: 2 mg via INTRAVENOUS
  Filled 2022-09-16: qty 1

## 2022-09-16 MED ORDER — SODIUM CHLORIDE 0.9% FLUSH: 3.0000 mL | INTRAVENOUS | Status: DC | PRN

## 2022-09-16 MED ORDER — SODIUM CHLORIDE 0.9 % IV SOLN: 250.0000 mL | INTRAVENOUS | Status: DC | PRN

## 2022-09-16 MED ORDER — ETOMIDATE 2 MG/ML IV SOLN
INTRAVENOUS | Status: AC
  Filled 2022-09-16: qty 20

## 2022-09-16 MED ORDER — MORPHINE 100MG IN NS 100ML (1MG/ML) PREMIX INFUSION
2.0000 mg/h | INTRAVENOUS | Status: DC
  Administered 2022-09-16: 2 mg/h via INTRAVENOUS
  Filled 2022-09-16: qty 100

## 2022-09-16 MED ORDER — ONDANSETRON HCL 4 MG/2ML IJ SOLN: 4.0000 mg | Freq: Four times a day (QID) | INTRAMUSCULAR | Status: DC | PRN

## 2022-09-16 MED ORDER — LORAZEPAM 2 MG/ML IJ SOLN
1.0000 mg | INTRAMUSCULAR | Status: DC | PRN
  Administered 2022-09-17: 2 mg via INTRAVENOUS
  Filled 2022-09-16: qty 1

## 2022-09-16 MED ORDER — POLYVINYL ALCOHOL 1.4 % OP SOLN: 1.0000 [drp] | Freq: Four times a day (QID) | OPHTHALMIC | Status: DC | PRN

## 2022-09-16 MED ORDER — ROCURONIUM BROMIDE 10 MG/ML (PF) SYRINGE
PREFILLED_SYRINGE | INTRAVENOUS | Status: AC
  Filled 2022-09-16: qty 10

## 2022-09-16 MED ORDER — DIPHENHYDRAMINE HCL 50 MG/ML IJ SOLN: 25.0000 mg | INTRAMUSCULAR | Status: DC | PRN

## 2022-09-16 MED ORDER — ACETAMINOPHEN 650 MG RE SUPP
650.0000 mg | RECTAL | Status: DC | PRN
  Administered 2022-09-17: 650 mg via RECTAL
  Filled 2022-09-16: qty 1

## 2022-09-16 MED ORDER — GLYCOPYRROLATE 0.2 MG/ML IJ SOLN
0.2000 mg | INTRAMUSCULAR | Status: DC | PRN
  Administered 2022-09-16 – 2022-09-17 (×2): 0.2 mg via INTRAVENOUS
  Filled 2022-09-16 (×2): qty 1

## 2022-09-16 MED ORDER — ONDANSETRON 4 MG PO TBDP: 4.0000 mg | ORAL_TABLET | Freq: Four times a day (QID) | ORAL | Status: DC | PRN

## 2022-09-16 MED ORDER — MORPHINE BOLUS VIA INFUSION: 2.0000 mg | INTRAVENOUS | Status: DC | PRN
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
## 2022-09-16 NOTE — Progress Notes (Signed)
Manufacturing engineer [facility])   Patient is a current [facility] hospice patient, with a terminal diagnosis of Alzheimer's Dementia, followed at [facility]. Patient is DNR.   Please call with any questions.    Kenna Gilbert BSN, [facility] Liaison [phone]

## 2022-09-16 NOTE — Progress Notes (Signed)
Received patient from the ED, patient is comfort care on a Morphine drip at '2mg'$ /hr. Patient is unresponsive with periods of apnea. Family is at bedside.

## 2022-09-16 NOTE — ED Provider Notes (Addendum)
[facility] EMERGENCY DEPARTMENT Provider Note   CSN: [id number] Arrival date & time: 09/21/2022  1125     History  Chief Complaint  Patient presents with   Tremors    KIETH Bean is a 88 y.o. male with advanced dementia on hospice, prostate cancer, erosive esophagitis, chronic subdural hematoma, aflutter, presents with AMS, fever, resp distress, hypotension.  Patient presents from a memory care unit at a nursing home. He reportedly pulled out his urine catheter last night, causing bleeding, which he has done in the past, and was ultimately found to be unresponsive. Unclear timeframe of unresponsiveness. Patient is normally confused, but interactive. Currently, patient is unresponsive to voice or painful stimuli, GCS3-4. On arrival to the emergency Department, patient Is found to be febrile to 104F, tachycardic with afib with RVR into the 140s, hypotensive to the 78L systolic and In respiratory distress on a nonrebreather. History is very limited due to patient unresponsiveness and unable to contact facility. Entirety of history provided by EMS.  HPI     Home Medications Prior to Admission medications   Medication Sig Start Date End Date Taking? Authorizing Provider  D2000 ULTRA STRENGTH 50 MCG (2000 UT) CAPS Take 2,000 Units by mouth daily. 05/25/22   [provider]  loratadine (CLARITIN) 10 MG tablet Take 10 mg by mouth daily.    [provider]  LORazepam (ATIVAN) 0.5 MG tablet Take 0.5 mg by mouth every 12 (twelve) hours as needed for anxiety.    [provider]  risperiDONE (RISPERDAL) 0.25 MG tablet Take 0.25 mg by mouth at bedtime. 08/14/22   [provider]      Allergies    Patient has no known allergies.    Review of Systems   Review of Systems  Unable to perform ROS: Patient unresponsive    Physical Exam Updated Vital Signs BP (!) 68/58   Pulse 140   Temp (!) 104.2 F (40.1 C) (Rectal)   Resp (!) 22   SpO2 100%   Physical Exam General: Acutely ill-appearing elderly male lying in bed HEENT: PERRLA, Sclera anicteric, dry mucous membranes, trachea midline, no JVD Cardiology: Irregular tachycardic rhythm, no murmurs/rubs/gallops. BL radial and DP pulses equal bilaterally.  Resp: Increased WOB with coarse breath sounds bilaterally. Satting well on NRB.  Abd: Soft, non-tender, non-distended. No rebound tenderness or guarding.  GU: Bright red blood from urethral meatus and in diaper. Brown stool in rectum.  MSK: No peripheral edema or signs of trauma. Extremities without deformity or TTP. No cyanosis or clubbing. Skin: hot, dry. No rashes or lesions. Back: stage 1 sacral pressure ulcer Neuro: GCS 3 - eyes closed, unresponsive to painful stimuli  ED Results / Procedures / Treatments   Labs (all labs ordered are listed, but only abnormal results are displayed) Labs Reviewed  CBG MONITORING, ED - Abnormal; Notable for the following components:      Result Value   Glucose-Capillary 106 (*)    All other components within normal limits    EKG None  Radiology No results found.  Procedures .Critical Care  Performed by: Audley Hose, MD Authorized by: Audley Hose, MD   Critical care provider statement:    Critical care time (minutes):  40   Critical care was necessary to treat or prevent imminent or life-threatening deterioration of the following conditions:  Cardiac failure, circulatory failure, CNS failure or compromise, dehydration, shock, sepsis and respiratory failure   Critical care was time spent personally by me on  the following activities:  Development of treatment plan with patient or surrogate, discussions with consultants, evaluation of patient's response to treatment, examination of patient, ordering and performing treatments and interventions, pulse oximetry, re-evaluation of patient's condition, review of old charts and obtaining history from patient or surrogate      Medications Ordered in ED Medications  rocuronium bromide 100 MG/10ML SOSY (has no administration in time range)  etomidate (AMIDATE) 2 MG/ML injection (has no administration in time range)  acetaminophen (TYLENOL) 650 MG suppository (has no administration in time range)    ED Course/ Medical Decision Making/ A&P                          Medical Decision Making Risk OTC drugs. Prescription drug management.   Given that patient is febrile, tachycardic, hypotensive, consider septic shock at the top of differential, with a possible urinary or respiratory source. Also consider intraabdominal source as well. Profound altered mental status, likely due to septic shock, though also consider electrolyte abnormalities, hypoglycemia, hyperglycemia, hypoxia/hypercapnea,or endocrine abnormality. Also consider other ideologies of shock such as hypovolemia/hemorrhage - patient did pull out his Foley, and there is bright red blood in his diaper, however, does not appear to be A significant enough amount to cause shock. Consider possibly decreased PO intake as another ideology of hypervolemic shock. Also consider cardiogenic Or obstructive shock given a patient is in afib with RVR, lower concern for anaphylactic shock.   Prepared to intubate patient while calling many phone numbers in order to get a hold of hospice facility or family to determine patient's wishes. Ultimately discussed extensively with patients sons and daughter-in-law and patient has made comfort care. Comfort measures are initiated for patient, including glycolpyrrolate, morphine.   Clinical Course as of 09/19/2022 2005  Tue Sep 16, 2022  1202 Patient is DNR, papers at bedside.  Unclear if patient would like to be intubated, have abx/pressors, have ICU level care.  Called 3 numbers associated with [facility].  Discussed with two liaisons who could not give me information and left a message for another 1.  Called 3 numbers associated with  patient's son and power of attorney [employer]  Called the number associated with patient's wife.  Finally called patient's daughter-in-law Laureano Hetzer who was with his power of attorney [employer].  They state that patient would like to be comfort care.  Confirmed with both Juliann Pulse and HCPOA Ray that patient would not want to be intubated, would not want any life prolonging measures, and would like to be comfort care at this time. Informed them that the patient would pass away as a result of what is happening without treatment and they state that they are prepared for that, that he would want to be comfort care. Patient made comfort care and order set placed. [HN]  6 Son Jeanell Sparrow and daughter-in-law Juliann Pulse at bedside confirming comfort care status.  They state that patient is on hospice for dementia at a memory care facility with his wife and his quality of life is not good and that he would not want any life prolonging measures to be initiated. Patient will be counseled to hospitalist. [HN]  1345 Hospitalist was consulted and palliative came to evaluate patient. Decided to start a morphine drip on this patient.  They agree that patient will be monitored to see how he does over the next few hours, whether he passes in the emergency department or if he remains stable he will  be admitted to the hospital. [HN]    Clinical Course User Index [HN] Audley Hose, MD          Final Clinical Impression(s) / ED Diagnoses Final diagnoses:  Septic shock ([place])  End of life care  Respiratory failure with hypoxia, unspecified chronicity ([place])  Atrial fibrillation with rapid ventricular response ([place])  Urethral bleeding    Rx / DC Orders ED Discharge Orders     None        This note was created using dictation software, which may contain spelling or grammatical errors.      Audley Hose, MD 10/06/2022 2018
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
## 2022-09-16 NOTE — ED Notes (Signed)
Pt was given the ordered robinul to assist in comfort by decreasing the secretions in his mouth helping him not cough as much and decreasing his chance of aspiration, which could increase discomfort. Pt was given ordered morphine to assist in decreasing respiration rate and signs of distress and or pain. Pt subjectively is showing signs of distress and or pain. The ordered morphine will assist in providing relief and comfort from those responses to the current medical condition.  Providing comfort measures only is the family's wishes confirmed by the attending provider

## 2022-09-16 NOTE — ED Provider Notes (Signed)
  Physical Exam  BP (!) 80/58   Pulse (!) 108   Temp (!) 104.2 F (40.1 C) (Rectal)   Resp (!) 31   SpO2 100%   Physical Exam  Procedures  Procedures  ED Course / MDM   Clinical Course as of 09/30/2022 1637  Tue Sep 16, 2022  1202 Patient is DNR, papers at bedside.  Unclear if patient would like to be intubated, have abx/pressors, have ICU level care.  Called 3 numbers associated with [facility].  Discussed with two liaisons who could not give me information and left a message for another 1.  Called 3 numbers associated with patient's son and power of attorney [employer].  Called the number associated with patient's wife.  Finally called patient's daughter-in-law Jaliel Deavers who was with his power of attorney [employer].  They state that patient would like to be comfort care.  Confirmed with both Juliann Pulse and HCPOA Ray that patient would not want to be intubated, would not want any life prolonging measures, and would like to be comfort care at this time. Informed them that the patient would pass away as a result of what is happening without treatment and they state that they are prepared for that, that he would want to be comfort care. Patient made comfort care and order set placed. [HN]  12 Son Jeanell Sparrow and daughter-in-law Juliann Pulse at bedside confirming comfort care status.  They state that patient is on hospice for dementia at a memory care facility with his wife and his quality of life is not good and that he would not want any life prolonging measures to be initiated. Patient will be counseled to hospitalist. [HN]  1345 Hospitalist was consulted and palliative came to evaluate patient. Decided to start a morphine drip on this patient.  They agree that patient will be monitored to see how he does over the next few hours, whether he passes in the emergency department or if he remains stable he will be admitted to the hospital. [HN]    Clinical Course User Index [HN] Audley Hose, MD   Medical  Decision Making Risk OTC drugs. Prescription drug management.   Assuming care of patient from Dr. Mayra Neer   Patient in the ED for fever, resp distress from SNF. Pt was DNR - now transitioned to hospice.  Family at the bedside along with pastor. Initial thought is that pt will likely pass away in the ER. On comfort care protocol.  Family had no complains, no concerns from the nursing side. Will continue to monitor.  If patient still has a pulse at 5:30 - will discuss with hospitalist.         Varney Biles, MD 10/06/2022 [phone]

## 2022-09-16 NOTE — Progress Notes (Signed)
Palliative:  Full consult to follow. Discussed with family at bedside. Full comfort care decided and discussed symptom management. Glenn Bean appears to be actively dying likely within the next 24 hours. Anticipate hospital death.   No charge  Vinie Sill, NP Palliative Medicine Team Pager [phone] (Please see [email or web address] for schedule) Team Phone [phone]

## 2022-09-16 NOTE — ED Notes (Signed)
Pt came in via ems in respiratory distress after a call from SNF c/o tremors and AMS.  Pt was quickly assessed. Blood work and cultures were obtained and pt was put on monitor. The details of the pts DNR were unknown at that time. MD called family and explained in her opinion the pt would pass by the end of the day if there is no treatment allowed. The family responded, "We are ready for that. He is to be comfort care only." This RN and CN dosed pt with tylenol suppository via verbal order after a temp of 104.2 was obtained rectally. This measure is considered comfort due to the incommutability of a high fever.  RN confirmed with attending provider for this pt that no fluids, or antibiotics will be given at this time, based on family instructions.

## 2022-09-16 NOTE — Progress Notes (Signed)
Patient briefly seen with family at bedside as well as with palliative care.  He appears to be near end-of-life and has been started on comfort care.  Morphine drip to be initiated by palliative care as discussed with family members who are currently in agreement.  Discussed with ED provider that we will need to give it a few hours on morphine drip to see if patient will remain stable enough for hospital admission.  Total care time: 28 minutes.

## 2022-09-16 NOTE — ED Triage Notes (Signed)
Pt brought in by [facility] from [place] for c/o tremors and O2 sats of 85%, pt does not normally wear O2; pt has been vomiting during the night  Pt was seen yesterday and had a foley catheter placed but pt continued to pull at foley until nurse removed it this am  Cbg 116

## 2022-09-17 DIAGNOSIS — I480 Paroxysmal atrial fibrillation: Secondary | ICD-10-CM | POA: Diagnosis present

## 2022-09-17 DIAGNOSIS — K219 Gastro-esophageal reflux disease without esophagitis: Secondary | ICD-10-CM | POA: Diagnosis present

## 2022-09-17 DIAGNOSIS — F0280 Dementia in other diseases classified elsewhere without behavioral disturbance: Secondary | ICD-10-CM | POA: Diagnosis present

## 2022-09-17 DIAGNOSIS — Z8546 Personal history of malignant neoplasm of prostate: Secondary | ICD-10-CM | POA: Diagnosis not present

## 2022-09-17 DIAGNOSIS — Z87891 Personal history of nicotine dependence: Secondary | ICD-10-CM | POA: Diagnosis not present

## 2022-09-17 DIAGNOSIS — R6510 Systemic inflammatory response syndrome (SIRS) of non-infectious origin without acute organ dysfunction: Secondary | ICD-10-CM | POA: Diagnosis present

## 2022-09-17 DIAGNOSIS — Z8249 Family history of ischemic heart disease and other diseases of the circulatory system: Secondary | ICD-10-CM | POA: Diagnosis not present

## 2022-09-17 DIAGNOSIS — F0390 Unspecified dementia without behavioral disturbance: Secondary | ICD-10-CM | POA: Diagnosis present

## 2022-09-17 DIAGNOSIS — I10 Essential (primary) hypertension: Secondary | ICD-10-CM | POA: Diagnosis present

## 2022-09-17 DIAGNOSIS — Z79899 Other long term (current) drug therapy: Secondary | ICD-10-CM | POA: Diagnosis not present

## 2022-09-17 DIAGNOSIS — Z66 Do not resuscitate: Secondary | ICD-10-CM | POA: Diagnosis present

## 2022-09-17 DIAGNOSIS — G309 Alzheimer's disease, unspecified: Secondary | ICD-10-CM | POA: Diagnosis present

## 2022-09-17 DIAGNOSIS — Z808 Family history of malignant neoplasm of other organs or systems: Secondary | ICD-10-CM | POA: Diagnosis not present

## 2022-09-17 DIAGNOSIS — Z515 Encounter for palliative care: Secondary | ICD-10-CM | POA: Diagnosis present

## 2022-09-17 DIAGNOSIS — E43 Unspecified severe protein-calorie malnutrition: Secondary | ICD-10-CM | POA: Diagnosis present

## 2022-09-17 DIAGNOSIS — Z6822 Body mass index (BMI) 22.0-22.9, adult: Secondary | ICD-10-CM | POA: Diagnosis not present

## 2022-09-17 DIAGNOSIS — M1990 Unspecified osteoarthritis, unspecified site: Secondary | ICD-10-CM | POA: Diagnosis present

## 2022-09-17 MED ORDER — MORPHINE SULFATE (PF) 2 MG/ML IV SOLN
2.0000 mg | INTRAVENOUS | Status: DC | PRN
  Administered 2022-09-17: 2 mg via INTRAVENOUS
  Filled 2022-09-17: qty 1

## 2022-10-08 NOTE — Progress Notes (Addendum)
[place] scan showed >98 mL.

## 2022-10-08 NOTE — Progress Notes (Signed)
Pt continues to be unresponsive and apneic, pt is on morphine drip at 2 mg/hr. Family is at the bedside. Hypotension noted, 55/41.

## 2022-10-08 NOTE — Discharge Summary (Signed)
  Hospital Course: -88 y.o. male, with history of advanced dementia on hospice, prostate cancer, erosive esophagitis, chronic subdural hematoma, atrial flutter admitted on 10/03/2022 with failure to thrive altered mentation fevers and hypotension -Patient was admitted on the comfort care/palliative care protocols -Patient expired at 18 PM with family at bedside  Subjective- -patient seen and evaluated with family at bedside, questions answered  Assessment and Plan: 1) recurrent falls with chronic subdural hematomas----admitted under comfort care protocols  2)SIRs with persistent fevers----cannot exclude possible aspiration episode -Admitted under comfort care/palliative protocols  3) dementia--- advanced patient admitted under comfort care protocols  4) history of prostate CA--- admitted under comfort care protocols  5)Social/Ethics--discussed with family at bedside...  -Comfort care protocol continued -Patient expired at 1710 PM with family at bedside

## 2022-10-08 NOTE — Progress Notes (Addendum)
Pt. Expired at 1710 with family at bedside. Apical pulse assessed by M. Howerton, RN and M. Kellie Simmering, LPN. No apical pulse heard. MD notifed. AC notified. [place] [facility] notified. Post-mortem flowsheet started.

## 2022-10-08 NOTE — Progress Notes (Signed)
  Transition of Care [facility]) Screening Note   Patient Details  Name: Glenn Bean Date of Birth: [birth year]   Transition of [facility]) CM/SW Contact:    Iona Beard, [place] Phone Number: 10-01-22, 10:55 AM  Pt is active hospice patient with [facility].   Transition of Care Department [facility]) has reviewed patient and no TOC needs have been identified at this time. We will continue to monitor patient advancement through interdisciplinary progression rounds. If new patient transition needs arise, please place a TOC consult.

## 2022-10-08 NOTE — Progress Notes (Addendum)
[facility]) Hospital Note  Glenn Bean was brought into [facility] ED for AMS, fever and hypotension.  Glenn Bean is an active hospice patient at [facility] in [place] with a terminal diagnosis of alzheimer's dementia with severe protein calorie malnutrition.  Per [place] MD, Cherie Ouch this is a related admission.  Visited patient at bedside.  Patient unresponsive, mouth open, does not appear to be in distress.  Family is at bedside, no concerns voiced.  GIP appropriate as he is receiving medications for comfort such as robinol and morphine drip.    VS 99.5 oral 55/41 HR 88 RR 18 Apneic  96% 4LNC  Labs None collected.  Diagnostics  None collected.  IV/PRN Medications: Morphine gttp 1-4 mg/h Robinol 0.2 mg  Problems List: - End of Life: Advanced dementia and now presenting w/ septic shock.  Family did not wish to pursue aggressive treatment and sought comfort care.  Patient transitioned to comfort care.  Hospital staff and IDT updated.    Transfer Summary and Medication list in patient chart.    Anticipated hospital death, actively dying.    [facility] Liaison team will continue to follow Glenn Bean.  Kenna Gilbert BSN, [place] [facility] Liaison  [phone]

## 2022-10-08 NOTE — Death Summary Note (Signed)
   DEATH SUMMARY   Patient Details  Name: DYWANE PERUSKI MRN: [id number] DOB: 22-Apr-1933 [id number], Mendel Ryder, NP Admission/Discharge Information   Admit Date:  Oct 13, 2022  Date of Death: Date of Death: Oct 14, 2022  Time of Death: Time of Death: 21  Length of Stay: 0   Principle Cause of death: SIRS with persistent fevers--unknown source of infection  Hospital Diagnoses: Principal Problem:   End of life care Active Problems:   Subdural hematoma, chronic (HCC)   Dementia without behavioral disturbance ([place])   Malignant neoplasm of prostate ([place])   Atrial flutter [facility])   Hospital Course: -88 y.o. male, with history of advanced dementia on hospice, prostate cancer, erosive esophagitis, chronic subdural hematoma, atrial flutter admitted on 10/13/22 with failure to thrive altered mentation fevers and hypotension -Patient was admitted on the comfort care/palliative care protocols -Patient expired at 72 PM with family at bedside  Subjective- -patient seen and evaluated with family at bedside, questions answered  Assessment and Plan: 1) recurrent falls with chronic subdural hematomas----admitted under comfort care protocols  2)SIRs with persistent fevers----cannot exclude possible aspiration episode -Admitted under comfort care/palliative protocols  3) dementia--- advanced patient admitted under comfort care protocols  4) history of prostate CA--- admitted under comfort care protocols  5)Social/Ethics--discussed with family at bedside...  -Comfort care protocol continued -Patient expired at 1710 PM with family at bedside  The results of significant diagnostics from this hospitalization (including imaging, microbiology, ancillary and laboratory) are listed below for reference.   Time spent: 38 minutes  Signed: Roxan Hockey, MD 10/14/22

## 2022-10-08 NOTE — Progress Notes (Signed)
Palliative:  HPI: 88 y.o. male  with past medical history of dementia, prostate cancer, erosive esophagitis, chronic subdural hematoma, atrial flutter admitted on 09/25/2022 with sepsis and family have decided to proceed with comfort care.   I met today at Glenn Bean bedside. Son is present. Glenn Bean is resting comfortably on morphine infusion. Breathing regular at current time but son describes apnea throughout the night. Glenn Bean continues to progress at end of life. Discussed with son the struggle of "just waiting" but he is glad his father is resting comfortably. Prognosis likely hours. Anticipate hospital death. Discussed also with Dr. Denton Brick.   Exam: Unresponsive. Breathing regular at current time. Facial muscles withdrawn. No grimacing. Muscles relaxed and no signs of distress or discomfort. Morphine infusion going.   Plan: - DNR - No changes to medication regimen.  - Continue full comfort care. Anticipate hospital death - prognosis likely hours.   25 min  Vinie Sill, NP Palliative Medicine Team Pager [phone] (Please see [email or web address] for schedule) Team Phone [phone]    Greater than 50%  of this time was spent counseling and coordinating care related to the above assessment and plan

## 2022-10-08 DEATH — deceased
